# Patient Record
Sex: Female | Born: 1945 | Race: White | Hispanic: No | Marital: Married | Smoking: Former smoker
Health system: Southern US, Community
[De-identification: ages and names within clinical notes are randomized; demographics above are authoritative.]

## PROBLEM LIST (undated history)

## (undated) DIAGNOSIS — I10 Essential (primary) hypertension: Secondary | ICD-10-CM

## (undated) DIAGNOSIS — F329 Major depressive disorder, single episode, unspecified: Secondary | ICD-10-CM

## (undated) DIAGNOSIS — M1990 Unspecified osteoarthritis, unspecified site: Secondary | ICD-10-CM

## (undated) DIAGNOSIS — F32A Depression, unspecified: Secondary | ICD-10-CM

## (undated) HISTORY — PX: TUBAL LIGATION: SHX77

## (undated) HISTORY — PX: FOOT SURGERY: SHX648

## (undated) HISTORY — PX: OTHER SURGICAL HISTORY: SHX169

## (undated) HISTORY — PX: SHOULDER SURGERY: SHX246

## (undated) HISTORY — PX: REPLACEMENT TOTAL KNEE: SUR1224

## (undated) HISTORY — PX: LAMINECTOMY: SHX219

---

## 2016-12-09 ENCOUNTER — Inpatient Hospital Stay (HOSPITAL_COMMUNITY)
Admission: EM | Admit: 2016-12-09 | Discharge: 2016-12-12 | DRG: 470 | Disposition: A | Discharge status: Home / Self Care | Payer: Medicare Other | Attending: Internal Medicine | Admitting: Internal Medicine

## 2016-12-09 ENCOUNTER — Encounter (HOSPITAL_COMMUNITY): Payer: Self-pay | Admitting: Emergency Medicine

## 2016-12-09 ENCOUNTER — Emergency Department (HOSPITAL_COMMUNITY): Payer: Medicare Other

## 2016-12-09 DIAGNOSIS — W19XXXA Unspecified fall, initial encounter: Secondary | ICD-10-CM

## 2016-12-09 DIAGNOSIS — F32A Depression, unspecified: Secondary | ICD-10-CM

## 2016-12-09 DIAGNOSIS — Z79899 Other long term (current) drug therapy: Secondary | ICD-10-CM | POA: Diagnosis not present

## 2016-12-09 DIAGNOSIS — E785 Hyperlipidemia, unspecified: Secondary | ICD-10-CM | POA: Diagnosis present

## 2016-12-09 DIAGNOSIS — Y92838 Other recreation area as the place of occurrence of the external cause: Secondary | ICD-10-CM

## 2016-12-09 DIAGNOSIS — W1789XA Other fall from one level to another, initial encounter: Secondary | ICD-10-CM | POA: Diagnosis present

## 2016-12-09 DIAGNOSIS — I10 Essential (primary) hypertension: Secondary | ICD-10-CM | POA: Diagnosis present

## 2016-12-09 DIAGNOSIS — Z419 Encounter for procedure for purposes other than remedying health state, unspecified: Secondary | ICD-10-CM

## 2016-12-09 DIAGNOSIS — F329 Major depressive disorder, single episode, unspecified: Secondary | ICD-10-CM | POA: Diagnosis present

## 2016-12-09 DIAGNOSIS — Z87891 Personal history of nicotine dependence: Secondary | ICD-10-CM

## 2016-12-09 DIAGNOSIS — Z96651 Presence of right artificial knee joint: Secondary | ICD-10-CM | POA: Diagnosis present

## 2016-12-09 DIAGNOSIS — S72002A Fracture of unspecified part of neck of left femur, initial encounter for closed fracture: Secondary | ICD-10-CM | POA: Diagnosis present

## 2016-12-09 DIAGNOSIS — E039 Hypothyroidism, unspecified: Secondary | ICD-10-CM | POA: Diagnosis present

## 2016-12-09 DIAGNOSIS — M25552 Pain in left hip: Secondary | ICD-10-CM | POA: Diagnosis present

## 2016-12-09 DIAGNOSIS — S72009A Fracture of unspecified part of neck of unspecified femur, initial encounter for closed fracture: Secondary | ICD-10-CM | POA: Diagnosis present

## 2016-12-09 DIAGNOSIS — Y9311 Activity, swimming: Secondary | ICD-10-CM | POA: Diagnosis present

## 2016-12-09 DIAGNOSIS — R9439 Abnormal result of other cardiovascular function study: Secondary | ICD-10-CM | POA: Diagnosis present

## 2016-12-09 HISTORY — DX: Depression, unspecified: F32.A

## 2016-12-09 HISTORY — DX: Major depressive disorder, single episode, unspecified: F32.9

## 2016-12-09 HISTORY — DX: Unspecified osteoarthritis, unspecified site: M19.90

## 2016-12-09 HISTORY — DX: Essential (primary) hypertension: I10

## 2016-12-09 LAB — COMPREHENSIVE METABOLIC PANEL
ALBUMIN: 4.3 g/dL (ref 3.5–5.0)
ALT: 13 U/L — AB (ref 14–54)
AST: 21 U/L (ref 15–41)
Alkaline Phosphatase: 60 U/L (ref 38–126)
Anion gap: 10 (ref 5–15)
BUN: 22 mg/dL — AB (ref 6–20)
CO2: 27 mmol/L (ref 22–32)
CREATININE: 1.02 mg/dL — AB (ref 0.44–1.00)
Calcium: 9.2 mg/dL (ref 8.9–10.3)
Chloride: 103 mmol/L (ref 101–111)
GFR calc Af Amer: >60 mL/min (ref >60)
GFR calc non Af Amer: 54 mL/min — ABNORMAL LOW (ref >60)
GLUCOSE: 95 mg/dL (ref 65–99)
POTASSIUM: 3.8 mmol/L (ref 3.5–5.1)
Sodium: 140 mmol/L (ref 135–145)
Total Bilirubin: 0.7 mg/dL (ref 0.3–1.2)
Total Protein: 6.6 g/dL (ref 6.5–8.1)

## 2016-12-09 LAB — URINALYSIS, ROUTINE W REFLEX MICROSCOPIC
BILIRUBIN URINE: NEGATIVE
GLUCOSE, UA: NEGATIVE mg/dL
HGB URINE DIPSTICK: NEGATIVE
Ketones, ur: NEGATIVE mg/dL
Leukocytes, UA: NEGATIVE
Nitrite: NEGATIVE
PROTEIN: NEGATIVE mg/dL
Specific Gravity, Urine: 1.005 (ref 1.005–1.030)
pH: 6 (ref 5.0–8.0)

## 2016-12-09 LAB — CBC WITH DIFFERENTIAL/PLATELET
Basophils Absolute: 0 10*3/uL (ref 0.0–0.1)
Basophils Relative: 0 %
EOS PCT: 0 %
Eosinophils Absolute: 0 10*3/uL (ref 0.0–0.7)
HCT: 39.8 % (ref 36.0–46.0)
Hemoglobin: 13.5 g/dL (ref 12.0–15.0)
LYMPHS ABS: 1.3 10*3/uL (ref 0.7–4.0)
LYMPHS PCT: 17 %
MCH: 30.6 pg (ref 26.0–34.0)
MCHC: 33.9 g/dL (ref 30.0–36.0)
MCV: 90.2 fL (ref 78.0–100.0)
MONO ABS: 0.7 10*3/uL (ref 0.1–1.0)
MONOS PCT: 9 %
Neutro Abs: 5.8 10*3/uL (ref 1.7–7.7)
Neutrophils Relative %: 74 %
PLATELETS: 190 10*3/uL (ref 150–400)
RBC: 4.41 MIL/uL (ref 3.87–5.11)
RDW: 12.7 % (ref 11.5–15.5)
WBC: 7.8 10*3/uL (ref 4.0–10.5)

## 2016-12-09 LAB — CBG MONITORING, ED: Glucose-Capillary: 97 mg/dL (ref 65–99)

## 2016-12-09 MED ORDER — HYDROCODONE-ACETAMINOPHEN 5-325 MG PO TABS
1.0000 | ORAL_TABLET | Freq: Four times a day (QID) | ORAL | Status: DC | PRN
  Filled 2016-12-09: qty 2

## 2016-12-09 MED ORDER — FENTANYL CITRATE (PF) 100 MCG/2ML IJ SOLN
50.0000 ug | INTRAMUSCULAR | Status: DC | PRN
  Administered 2016-12-09 (×2): 50 ug via INTRAVENOUS
  Filled 2016-12-09 (×2): qty 2

## 2016-12-09 MED ORDER — HYDROMORPHONE HCL-NACL 0.5-0.9 MG/ML-% IV SOSY
0.5000 mg | PREFILLED_SYRINGE | INTRAVENOUS | Status: DC | PRN
  Administered 2016-12-09 – 2016-12-11 (×7): 0.5 mg via INTRAVENOUS
  Filled 2016-12-09 (×8): qty 1

## 2016-12-09 MED ORDER — POVIDONE-IODINE 10 % EX SWAB
2.0000 | Freq: Once | CUTANEOUS | Status: AC
Start: 2016-12-10 — End: 2016-12-10
  Administered 2016-12-10: 2 via TOPICAL

## 2016-12-09 MED ORDER — DOCUSATE SODIUM 100 MG PO CAPS
100.0000 mg | ORAL_CAPSULE | Freq: Two times a day (BID) | ORAL | Status: DC
  Administered 2016-12-09 – 2016-12-12 (×5): 100 mg via ORAL
  Filled 2016-12-09 (×5): qty 1

## 2016-12-09 MED ORDER — POLYETHYLENE GLYCOL 3350 17 G PO PACK: 17.0000 g | PACK | Freq: Every day | ORAL | Status: DC | PRN

## 2016-12-09 MED ORDER — CHLORHEXIDINE GLUCONATE 4 % EX LIQD
60.0000 mL | Freq: Once | CUTANEOUS | Status: AC
  Administered 2016-12-10: 4 via TOPICAL
  Filled 2016-12-09: qty 60

## 2016-12-09 MED ORDER — TRANEXAMIC ACID 1000 MG/10ML IV SOLN
1000.0000 mg | INTRAVENOUS | Status: AC
  Administered 2016-12-10: 1000 mg via INTRAVENOUS
  Filled 2016-12-09 (×2): qty 10

## 2016-12-09 MED ORDER — ENOXAPARIN SODIUM 30 MG/0.3ML ~~LOC~~ SOLN: 30.0000 mg | SUBCUTANEOUS | Status: DC

## 2016-12-09 MED ORDER — CEFAZOLIN SODIUM-DEXTROSE 2-4 GM/100ML-% IV SOLN
2.0000 g | INTRAVENOUS | Status: AC
  Administered 2016-12-10: 2 g via INTRAVENOUS

## 2016-12-09 MED ORDER — HYDROMORPHONE HCL 1 MG/ML IJ SOLN
1.0000 mg | INTRAMUSCULAR | Status: DC | PRN
  Administered 2016-12-09: 1 mg via INTRAVENOUS
  Filled 2016-12-09: qty 1

## 2016-12-09 MED ORDER — ENOXAPARIN SODIUM 40 MG/0.4ML ~~LOC~~ SOLN: 40.0000 mg | SUBCUTANEOUS | Status: DC

## 2016-12-09 NOTE — ED Notes (Signed)
Bed: [id number] Expected date:  Expected time:  Means of arrival:  Comments: Fall, hip pain

## 2016-12-09 NOTE — H&P (Signed)
History and Physical    Caitlin StarchSusan Hancock ZOX:[id number] DOB: [birth year] DOA: 12/09/2016  PCP: System, Pcp Not In  Patient coming from: Patient is from [place], here for vacation.   I have personally briefly reviewed patient's old medical records in [facility]  Chief Complaint: Fall, left hip pain.   HPI: Caitlin Hancock is a 71 y.o. female with medical history significant of HTN, Depression, hypothyroidism, hyperlipidemia, Abnormal stress test last year, who presents after a mechanical fall. Patient is from [place], she is here on vacation,visitingfriends. She was getting out of the pool,when she slipped, lost balance and fell on her left side. Immediatly she felt left hip pain.  She denies chest pain, dizziness, dyspnea prior to the fall.  She report SOB on exertion,after walking half mile. She had an abnormal stress test, last year. She lost 30 pounds anda " repeated stress test was better "   ED Course: Cr 1.0, normal electrolytes, Hb at 13, UA negative, chest x ray no active lung diseases, hip x ray; Acute left femoral neck fracture with proximal migration of the femoral shaft and coxa vara angulation.  Review of Systems: As per HPI otherwise 10 point review of systems negative.    Past Medical History:  Diagnosis Date  . Arthritis   . Depression   . Hypertension   Hypothyroidism Hyperlipidemia Depression HTN Degeneration of intervertebral disc    Past Surgical History:  Procedure Laterality Date  . bilateral cataract extractions    . FOOT SURGERY    . LAMINECTOMY    . left achilles tendon repair    . REPLACEMENT TOTAL KNEE    . SHOULDER SURGERY    . TUBAL LIGATION       reports that she has quit smoking. She has never used smokeless tobacco. She reports that she drinks alcohol. She reports that she does not use drugs.  No Known Allergies  Family History; Father died from complication of MVA Mother, had gastrectomy and probably died of infection, in her 5340.     Prior to Admission medications   Not on File    Physical Exam: Vitals:   12/09/16 1508 12/09/16 1530 12/09/16 1615 12/09/16 1700  BP: 138/70 136/63 (!) 148/57 111/84  Pulse: (!) 57 (!) 55 (!) 57 62  Resp: 17 13 (!) 21 17  Temp:      TempSrc:      SpO2: 96% 96% 96% 94%    Constitutional: NAD, calm, comfortable Vitals:   12/09/16 1508 12/09/16 1530 12/09/16 1615 12/09/16 1700  BP: 138/70 136/63 (!) 148/57 111/84  Pulse: (!) 57 (!) 55 (!) 57 62  Resp: 17 13 (!) 21 17  Temp:      TempSrc:      SpO2: 96% 96% 96% 94%   Eyes: PERRL, lids and conjunctivae normal ENMT: Mucous membranes are moist. Posterior pharynx clear of any exudate or lesions.Normal dentition.  Neck: normal, supple, no masses, no thyromegaly Respiratory: clear to auscultation bilaterally, no wheezing, no crackles. Normal respiratory effort. No accessory muscle use.  Cardiovascular: Regular rate and rhythm, no murmurs / rubs / gallops. No extremity edema. 2+ pedal pulses. No carotid bruits.  Abdomen: no tenderness, no masses palpated. No hepatosplenomegaly. Bowel sounds positive.  Musculoskeletal: no clubbing / cyanosis. Left LE, shorter than right, and rotated  Skin: no rashes, lesions, ulcers. No induration Neurologic: CN 2-12 grossly intact. Sensation intact, DTR normal. Strength 5/5 all except left LE not evaluated due to hip fracture.  Psychiatric: Normal judgment  and insight. Alert and oriented x 3. Normal mood.    Labs on Admission: I have personally reviewed following labs and imaging studies  CBC:  Recent Labs Lab 12/09/16 1518  WBC 7.8  NEUTROABS 5.8  HGB 13.5  HCT 39.8  MCV 90.2  PLT 190   Basic Metabolic Panel:  Recent Labs Lab 12/09/16 1518  NA 140  K 3.8  CL 103  CO2 27  GLUCOSE 95  BUN 22*  CREATININE 1.02*  CALCIUM 9.2   GFR: CrCl cannot be calculated (Unknown ideal weight.). Liver Function Tests:  Recent Labs Lab 12/09/16 1518  AST 21  ALT 13*  ALKPHOS 60   BILITOT 0.7  PROT 6.6  ALBUMIN 4.3   No results for input(s): LIPASE, AMYLASE in the last 168 hours. No results for input(s): AMMONIA in the last 168 hours. Coagulation Profile: No results for input(s): INR, PROTIME in the last 168 hours. Cardiac Enzymes: No results for input(s): CKTOTAL, CKMB, CKMBINDEX, TROPONINI in the last 168 hours. BNP (last 3 results) No results for input(s): PROBNP in the last 8760 hours. HbA1C: No results for input(s): HGBA1C in the last 72 hours. CBG:  Recent Labs Lab 12/09/16 1501  GLUCAP 97   Lipid Profile: No results for input(s): CHOL, HDL, LDLCALC, TRIG, CHOLHDL, LDLDIRECT in the last 72 hours. Thyroid Function Tests: No results for input(s): TSH, T4TOTAL, FREET4, T3FREE, THYROIDAB in the last 72 hours. Anemia Panel: No results for input(s): VITAMINB12, FOLATE, FERRITIN, TIBC, IRON, RETICCTPCT in the last 72 hours. Urine analysis:    Component Value Date/Time   COLORURINE STRAW (A) 12/09/2016 1518   APPEARANCEUR CLEAR 12/09/2016 1518   LABSPEC 1.005 12/09/2016 1518   PHURINE 6.0 12/09/2016 1518   GLUCOSEU NEGATIVE 12/09/2016 1518   HGBUR NEGATIVE 12/09/2016 1518   BILIRUBINUR NEGATIVE 12/09/2016 1518   KETONESUR NEGATIVE 12/09/2016 1518   PROTEINUR NEGATIVE 12/09/2016 1518   NITRITE NEGATIVE 12/09/2016 1518   LEUKOCYTESUR NEGATIVE 12/09/2016 1518    Radiological Exams on Admission: Dg Chest 1 View  Result Date: 12/09/2016 CLINICAL DATA:  Larey Seat today with left hip pain EXAM: CHEST 1 VIEW COMPARISON:  None. FINDINGS: No active infiltrate or effusion is seen. Mediastinal and hilar contours are unremarkable. The heart is within upper limits of normal. No acute bony abnormality is seen. There appears to be old resection of the distal left clavicle. IMPRESSION: No active lung disease.  No acute abnormality. Electronically Signed   By: Dwyane Dee M.D.   On: 12/09/2016 16:07   Dg Hip Unilat W Or Wo Pelvis 2-3 Views Left  Result Date:  12/09/2016 CLINICAL DATA:  71 y/o  F; fall with left hip pain. EXAM: DG HIP (WITH OR WITHOUT PELVIS) 2-3V LEFT COMPARISON:  None. FINDINGS: Acute left femoral neck fracture with proximal migration of the femoral shaft and coxa vara angulation. No hip dislocation. No pelvic fracture identified. IMPRESSION: Acute left femoral neck fracture with proximal migration of the femoral shaft and coxa vara angulation. Electronically Signed   By: Mitzi Hansen M.D.   On: 12/09/2016 16:10    EKG: Independently reviewed. Sinus rhythm ,bordeline T wave abnormalities.   Assessment/Plan Active Problems:   Closed left hip fracture (HCC)   Essential hypertension   Hypothyroidism   Depression  1-Left Hip Fracture;  Admit to telemetry.  Ortho consulted.  Patient has history of abnormal stress test. Will need to reviewed records prior to surgery. I have place order to request record. Will also order ECHO EKG; sinus  rythm. Will also check ECHO.  Pain management ; IV dilaudid,.  Bowel regimen ordered.  DVT prophylasix per ortho   2-Hypothyroidism. Awaiting med recto start meds.   3-HTN;  On metoprolol. Unknown dose.  4-HLD;not taking statins.   5-Others; she has Albuterol inhaler, she denies diagnosis of COPD. Does not use inhale frequently  Family will bring her meds, tonight. Chronic medications will need to be order.   DVT prophylaxis: Per ortho Code Status: Full code.  Family Communication: care discussed with patient.  Disposition Plan: to be determine.  Consults called: ortho, Dr Luiz Blare consulted by ED Admission status: inpatient.    Alba Cory MD [facility] Pager [phone]  If 7PM-7AM, please contact night-coverage [email or web address] Password [facility]  12/09/2016, 5:23 PM

## 2016-12-09 NOTE — Care Management (Signed)
ED CM reviewed CM consult. Patient is being admitted. CM will continue to follow for discharge needs. Rubie Maidrystal Gilles Trimpe RN CCM

## 2016-12-09 NOTE — ED Triage Notes (Addendum)
Per EMS, patient from home, c/o slip and fall while getting out of the pool. C/o left hip pain. Rotation noted. Denies head injury and LOC. Denies blood thinner use.   20g R AC Fentanyl with EMS  BP 148/70 HR 82 RR 18 O2 95%

## 2016-12-09 NOTE — ED Notes (Signed)
Hospitalist at bedside 

## 2016-12-09 NOTE — Consult Note (Signed)
Reason for Consult: Femoral neck fracture left side Referring Physician: Hospitalists  Caitlin Hancock is an 71 y.o. female.  HPI: The patient is a very active 71 year old female who is visiting from [place] and fell earlier today in a swimming pool. She is very active in her community at home and has had some previous hip pain. She's had a history of arthritis and has had a previous right total knee replacement. She was noted on x-ray to have a femoral neck fracture and we are consult for management.  Past Medical History:  Diagnosis Date  . Arthritis   . Depression   . Hypertension     Past Surgical History:  Procedure Laterality Date  . bilateral cataract extractions    . FOOT SURGERY    . LAMINECTOMY    . left achilles tendon repair    . REPLACEMENT TOTAL KNEE    . SHOULDER SURGERY    . TUBAL LIGATION      History reviewed. No pertinent family history.  Social History:  reports that she has quit smoking. She has never used smokeless tobacco. She reports that she drinks alcohol. She reports that she does not use drugs.  Allergies: No Known Allergies  Medications: I have reviewed the patient's current medications.  Results for orders placed or performed during the hospital encounter of 12/09/16 (from the past 48 hour(s))  CBG monitoring, ED     Status: None   Collection Time: 12/09/16  3:01 PM  Result Value Ref Range   Glucose-Capillary 97 65 - 99 mg/dL  Comprehensive metabolic panel     Status: Abnormal   Collection Time: 12/09/16  3:18 PM  Result Value Ref Range   Sodium 140 135 - 145 mmol/L   Potassium 3.8 3.5 - 5.1 mmol/L   Chloride 103 101 - 111 mmol/L   CO2 27 22 - 32 mmol/L   Glucose, Bld 95 65 - 99 mg/dL   BUN 22 (H) 6 - 20 mg/dL   Creatinine, Ser 1.02 (H) 0.44 - 1.00 mg/dL   Calcium 9.2 8.9 - 10.3 mg/dL   Total Protein 6.6 6.5 - 8.1 g/dL   Albumin 4.3 3.5 - 5.0 g/dL   AST 21 15 - 41 U/L   ALT 13 (L) 14 - 54 U/L   Alkaline Phosphatase 60 38 - 126 U/L   Total Bilirubin 0.7 0.3 - 1.2 mg/dL   GFR calc non Af Amer 54 (L) >60 mL/min   GFR calc Af Amer >60 >60 mL/min    Comment: (NOTE) The eGFR has been calculated using the CKD EPI equation. This calculation has not been validated in all clinical situations. eGFR's persistently <60 mL/min signify possible Chronic Kidney Disease.    Anion gap 10 5 - 15  CBC with Differential     Status: None   Collection Time: 12/09/16  3:18 PM  Result Value Ref Range   WBC 7.8 4.0 - 10.5 K/uL   RBC 4.41 3.87 - 5.11 MIL/uL   Hemoglobin 13.5 12.0 - 15.0 g/dL   HCT 39.8 36.0 - 46.0 %   MCV 90.2 78.0 - 100.0 fL   MCH 30.6 26.0 - 34.0 pg   MCHC 33.9 30.0 - 36.0 g/dL   RDW 12.7 11.5 - 15.5 %   Platelets 190 150 - 400 K/uL   Neutrophils Relative % 74 %   Neutro Abs 5.8 1.7 - 7.7 K/uL   Lymphocytes Relative 17 %   Lymphs Abs 1.3 0.7 - 4.0 K/uL  Monocytes Relative 9 %   Monocytes Absolute 0.7 0.1 - 1.0 K/uL   Eosinophils Relative 0 %   Eosinophils Absolute 0.0 0.0 - 0.7 K/uL   Basophils Relative 0 %   Basophils Absolute 0.0 0.0 - 0.1 K/uL  Urinalysis, Routine w reflex microscopic     Status: Abnormal   Collection Time: 12/09/16  3:18 PM  Result Value Ref Range   Color, Urine STRAW (A) YELLOW   APPearance CLEAR CLEAR   Specific Gravity, Urine 1.005 1.005 - 1.030   pH 6.0 5.0 - 8.0   Glucose, UA NEGATIVE NEGATIVE mg/dL   Hgb urine dipstick NEGATIVE NEGATIVE   Bilirubin Urine NEGATIVE NEGATIVE   Ketones, ur NEGATIVE NEGATIVE mg/dL   Protein, ur NEGATIVE NEGATIVE mg/dL   Nitrite NEGATIVE NEGATIVE   Leukocytes, UA NEGATIVE NEGATIVE    Dg Chest 1 View  Result Date: 12/09/2016 CLINICAL DATA:  [place] today with left hip pain EXAM: CHEST 1 VIEW COMPARISON:  None. FINDINGS: No active infiltrate or effusion is seen. Mediastinal and hilar contours are unremarkable. The heart is within upper limits of normal. No acute bony abnormality is seen. There appears to be old resection of the distal left clavicle.  IMPRESSION: No active lung disease.  No acute abnormality. Electronically Signed   By: Ivar Drape M.D.   On: 12/09/2016 16:07   Dg Hip Unilat W Or Wo Pelvis 2-3 Views Left  Result Date: 12/09/2016 CLINICAL DATA:  71 y/o  F; fall with left hip pain. EXAM: DG HIP (WITH OR WITHOUT PELVIS) 2-3V LEFT COMPARISON:  None. FINDINGS: Acute left femoral neck fracture with proximal migration of the femoral shaft and coxa vara angulation. No hip dislocation. No pelvic fracture identified. IMPRESSION: Acute left femoral neck fracture with proximal migration of the femoral shaft and coxa vara angulation. Electronically Signed   By: Kristine Garbe M.D.   On: 12/09/2016 16:10    ROS  ROS: I have reviewed the patient's review of systems thoroughly and there are no positive responses as relates to the HPI. Blood pressure (!) 128/49, pulse 60, temperature 98.1 F (36.7 C), temperature source Oral, resp. rate 18, height '5\' 5"'$  (1.651 m), weight 80 kg (176 lb 4.8 oz), SpO2 97 %. Physical Exam Well-developed well-nourished patient in no acute distress. Alert and oriented x3 HEENT:within normal limits Cardiac: Regular rate and rhythm Pulmonary: Lungs clear to auscultation Abdomen: Soft and nontender.  Normal active bowel sounds  Musculoskeletal: Left hip: Pain with all range of motion. Inability to flex or extend. Neurovascularly intact distally. Left groin has no significant skin issues. Assessment/Plan: 71 year old extremely active female who fell earlier today in a swimming pool after arriving from [place]. She has a femoral neck fracture with displacement.//Given her young age and extremely high activity level I think she will be better suited to total hip replacement and hemiarthroplasty. I will plan for total hip replacement tomorrow. She will be nothing by mouth after midnight tonight. I have explained the surgery to the patient as well as the risks and benefits at length. She understands the risk of  bleeding, infection, dislocation, need for further surgery, and potential although slight of death and around the time of surgery. She wishes to proceed with the surgical intervention. She will be admitted and cleared via the internal medicine service and we will plan intervention tomorrow.  Darrol Brandenburg L 12/09/2016, 6:43 PM

## 2016-12-09 NOTE — ED Provider Notes (Signed)
[facility]-EMERGENCY DEPT Provider Note   CSN: [id number] Arrival date & time: 12/09/16  1400     History   Chief Complaint Chief Complaint  Patient presents with  . Fall  . Hip Pain    HPI Lucrezia StarchSusan Galster is a 71 y.o. female.  The history is provided by the patient. No language interpreter was used.  Fall   Hip Pain    Lucrezia StarchSusan Schrom is a 71 y.o. female who presents to the Emergency Department complaining of fall. She was walking to get out of the pool and slipped after missing a step and fell, landing onto her left hip. She denies any head injury or loss of consciousness. She reports severe immediate pain to her left hip and she is unable to move her leg. She does have some numbness in her left foot. She has a history of hypertension and takes no blood thinners. Symptoms are severe and constant in nature. Past Medical History:  Diagnosis Date  . Arthritis     There are no active problems to display for this patient.   Past Surgical History:  Procedure Laterality Date  . FOOT SURGERY    . LAMINECTOMY    . REPLACEMENT TOTAL KNEE    . SHOULDER SURGERY      OB History    No data available       Home Medications    Prior to Admission medications   Not on File    Family History No family history on file.  Social History Social History  Substance Use Topics  . Smoking status: Former Games developermoker  . Smokeless tobacco: Never Used  . Alcohol use Yes     Allergies   Patient has no known allergies.   Review of Systems Review of Systems  All other systems reviewed and are negative.    Physical Exam Updated Vital Signs BP 138/70   Pulse (!) 57   Temp 98.1 F (36.7 C) (Oral)   Resp 17   SpO2 96%   Physical Exam  Constitutional: She is oriented to person, place, and time. She appears well-developed and well-nourished.  HENT:  Head: Normocephalic and atraumatic.  Cardiovascular: Normal rate and regular rhythm.   No murmur heard. Pulmonary/Chest: Effort  normal and breath sounds normal. No respiratory distress.  Abdominal: Soft. There is no tenderness. There is no rebound and no guarding.  Musculoskeletal:  Left lower extremity is externally rotated and shortened. 2+ DP pulses bilaterally.  Neurological: She is alert and oriented to person, place, and time.  Skin: Skin is warm and dry.  Psychiatric: She has a normal mood and affect. Her behavior is normal.  Nursing note and vitals reviewed.    ED Treatments / Results  Labs (all labs ordered are listed, but only abnormal results are displayed) Labs Reviewed  COMPREHENSIVE METABOLIC PANEL - Abnormal; Notable for the following:       Result Value   BUN 22 (*)    Creatinine, Ser 1.02 (*)    ALT 13 (*)    GFR calc non Af Amer 54 (*)    All other components within normal limits  CBC WITH DIFFERENTIAL/PLATELET  URINALYSIS, ROUTINE W REFLEX MICROSCOPIC  CBG MONITORING, ED    EKG  EKG Interpretation  Date/Time:  Tuesday December 09 2016 14:59:54 EDT Ventricular Rate:  59 PR Interval:    QRS Duration: 89 QT Interval:  418 QTC Calculation: 415 R Axis:   64 Text Interpretation:  Sinus rhythm Borderline T abnormalities, anterior leads  Baseline wander Confirmed by Lincoln Brigham [phone]) on 12/09/2016 3:22:31 PM Also confirmed by Lincoln Brigham [phone]), editor Madalyn Rob [phone])  on 12/09/2016 4:20:10 PM       Radiology Dg Chest 1 View  Result Date: 12/09/2016 CLINICAL DATA:  Larey Seat today with left hip pain EXAM: CHEST 1 VIEW COMPARISON:  None. FINDINGS: No active infiltrate or effusion is seen. Mediastinal and hilar contours are unremarkable. The heart is within upper limits of normal. No acute bony abnormality is seen. There appears to be old resection of the distal left clavicle. IMPRESSION: No active lung disease.  No acute abnormality. Electronically Signed   By: Dwyane Dee M.D.   On: 12/09/2016 16:07   Dg Hip Unilat W Or Wo Pelvis 2-3 Views Left  Result Date: 12/09/2016 CLINICAL DATA:   71 y/o  F; fall with left hip pain. EXAM: DG HIP (WITH OR WITHOUT PELVIS) 2-3V LEFT COMPARISON:  None. FINDINGS: Acute left femoral neck fracture with proximal migration of the femoral shaft and coxa vara angulation. No hip dislocation. No pelvic fracture identified. IMPRESSION: Acute left femoral neck fracture with proximal migration of the femoral shaft and coxa vara angulation. Electronically Signed   By: Mitzi Hansen M.D.   On: 12/09/2016 16:10    Procedures Procedures (including critical care time)  Medications Ordered in ED Medications  fentaNYL (SUBLIMAZE) injection 50 mcg (50 mcg Intravenous Given 12/09/16 1616)     Initial Impression / Assessment and Plan / ED Course  I have reviewed the triage vital signs and the nursing notes.  Pertinent labs & imaging results that were available during my care of the patient were reviewed by me and considered in my medical decision making (see chart for details).     Patient here for evaluation of injuries following a mechanical fall. She has left femoral neck fracture on imaging with good perfusion distally. Discussed with Dr. Luiz Blare with orthopedics who will see the patient in the emergency department. He will plan for surgery in the morning once cleared by medicine. Hospitalist consultation for admission. Patient updated findings of studies she is in agreement with plan for admission for further treatment.    Final Clinical Impressions(s) / ED Diagnoses   Final diagnoses:  Closed fracture of neck of left femur, initial encounter [facility])  Fall, initial encounter    New Prescriptions New Prescriptions   No medications on file     Tilden Fossa, MD 12/09/16 1625

## 2016-12-10 ENCOUNTER — Encounter (HOSPITAL_COMMUNITY): Payer: Self-pay | Admitting: Anesthesiology

## 2016-12-10 ENCOUNTER — Inpatient Hospital Stay (HOSPITAL_COMMUNITY): Payer: Medicare Other | Admitting: Anesthesiology

## 2016-12-10 ENCOUNTER — Inpatient Hospital Stay (HOSPITAL_COMMUNITY): Payer: Medicare Other

## 2016-12-10 ENCOUNTER — Encounter (HOSPITAL_COMMUNITY)
Admission: EM | Disposition: A | Discharge status: Home / Self Care | Payer: Self-pay | Source: Home / Self Care | Attending: Internal Medicine

## 2016-12-10 DIAGNOSIS — I10 Essential (primary) hypertension: Secondary | ICD-10-CM

## 2016-12-10 HISTORY — PX: TOTAL HIP ARTHROPLASTY: SHX124

## 2016-12-10 LAB — ECHOCARDIOGRAM COMPLETE
Height: 65 in
Weight: 2820.8 oz

## 2016-12-10 LAB — BASIC METABOLIC PANEL
ANION GAP: 9 (ref 5–15)
BUN: 16 mg/dL (ref 6–20)
CHLORIDE: 100 mmol/L — AB (ref 101–111)
CO2: 30 mmol/L (ref 22–32)
Calcium: 9.1 mg/dL (ref 8.9–10.3)
Creatinine, Ser: 0.88 mg/dL (ref 0.44–1.00)
GFR calc non Af Amer: >60 mL/min (ref >60)
Glucose, Bld: 115 mg/dL — ABNORMAL HIGH (ref 65–99)
POTASSIUM: 3.6 mmol/L (ref 3.5–5.1)
SODIUM: 139 mmol/L (ref 135–145)

## 2016-12-10 LAB — CBC
HCT: 38.2 % (ref 36.0–46.0)
Hemoglobin: 12.9 g/dL (ref 12.0–15.0)
MCH: 30.8 pg (ref 26.0–34.0)
MCHC: 33.8 g/dL (ref 30.0–36.0)
MCV: 91.2 fL (ref 78.0–100.0)
Platelets: 188 K/uL (ref 150–400)
RBC: 4.19 MIL/uL (ref 3.87–5.11)
RDW: 12.9 % (ref 11.5–15.5)
WBC: 8 K/uL (ref 4.0–10.5)

## 2016-12-10 LAB — SURGICAL PCR SCREEN
MRSA, PCR: NEGATIVE
STAPHYLOCOCCUS AUREUS: NEGATIVE

## 2016-12-10 SURGERY — ARTHROPLASTY, HIP, TOTAL, ANTERIOR APPROACH
Anesthesia: Spinal | Site: Hip | Laterality: Left

## 2016-12-10 SURGERY — Surgical Case
Anesthesia: *Unknown

## 2016-12-10 MED ORDER — METOPROLOL TARTRATE 25 MG PO TABS
25.0000 mg | ORAL_TABLET | Freq: Two times a day (BID) | ORAL | Status: DC
  Administered 2016-12-11 – 2016-12-12 (×3): 25 mg via ORAL
  Filled 2016-12-10 (×3): qty 1

## 2016-12-10 MED ORDER — PHENYLEPHRINE 40 MCG/ML (10ML) SYRINGE FOR IV PUSH (FOR BLOOD PRESSURE SUPPORT)
PREFILLED_SYRINGE | INTRAVENOUS | Status: DC | PRN
Start: 2016-12-10 — End: 2016-12-10
  Administered 2016-12-10 (×2): 40 ug via INTRAVENOUS

## 2016-12-10 MED ORDER — ALBUTEROL SULFATE (2.5 MG/3ML) 0.083% IN NEBU
3.0000 mL | INHALATION_SOLUTION | Freq: Four times a day (QID) | RESPIRATORY_TRACT | Status: DC
  Filled 2016-12-10: qty 3

## 2016-12-10 MED ORDER — PANTOPRAZOLE SODIUM 40 MG PO TBEC
40.0000 mg | DELAYED_RELEASE_TABLET | Freq: Every day | ORAL | Status: DC
  Administered 2016-12-11 – 2016-12-12 (×2): 40 mg via ORAL
  Filled 2016-12-10 (×2): qty 1

## 2016-12-10 MED ORDER — METHOCARBAMOL 500 MG PO TABS
500.0000 mg | ORAL_TABLET | Freq: Three times a day (TID) | ORAL | Status: DC
  Administered 2016-12-10: 500 mg via ORAL
  Filled 2016-12-10: qty 1

## 2016-12-10 MED ORDER — ALBUTEROL SULFATE (2.5 MG/3ML) 0.083% IN NEBU: 3.0000 mL | INHALATION_SOLUTION | Freq: Four times a day (QID) | RESPIRATORY_TRACT | Status: DC | PRN

## 2016-12-10 MED ORDER — LACTATED RINGERS IV SOLN
INTRAVENOUS | Status: DC
  Administered 2016-12-10: 20:00:00 via INTRAVENOUS

## 2016-12-10 MED ORDER — GABAPENTIN 100 MG PO CAPS
100.0000 mg | ORAL_CAPSULE | Freq: Every day | ORAL | Status: DC
  Administered 2016-12-11: 100 mg via ORAL
  Filled 2016-12-10: qty 1

## 2016-12-10 MED ORDER — PHENYLEPHRINE HCL 10 MG/ML IJ SOLN
INTRAMUSCULAR | Status: DC | PRN
  Administered 2016-12-10: 50 ug/min via INTRAVENOUS

## 2016-12-10 MED ORDER — FENTANYL CITRATE (PF) 100 MCG/2ML IJ SOLN
25.0000 ug | INTRAMUSCULAR | Status: DC | PRN
  Administered 2016-12-10: 25 ug via INTRAVENOUS

## 2016-12-10 MED ORDER — METHOCARBAMOL 500 MG PO TABS
500.0000 mg | ORAL_TABLET | Freq: Four times a day (QID) | ORAL | Status: DC | PRN
  Administered 2016-12-12: 500 mg via ORAL
  Filled 2016-12-10: qty 1

## 2016-12-10 MED ORDER — PROPOFOL 500 MG/50ML IV EMUL
INTRAVENOUS | Status: DC | PRN
  Administered 2016-12-10: 50 ug/kg/min via INTRAVENOUS

## 2016-12-10 MED ORDER — DEXAMETHASONE SODIUM PHOSPHATE 10 MG/ML IJ SOLN
INTRAMUSCULAR | Status: DC | PRN
  Administered 2016-12-10: 10 mg via INTRAVENOUS

## 2016-12-10 MED ORDER — ONDANSETRON HCL 4 MG/2ML IJ SOLN: 4.0000 mg | Freq: Once | INTRAMUSCULAR | Status: DC | PRN

## 2016-12-10 MED ORDER — PROPOFOL 10 MG/ML IV BOLUS
INTRAVENOUS | Status: AC
  Filled 2016-12-10: qty 20

## 2016-12-10 MED ORDER — BUPIVACAINE HCL (PF) 0.25 % IJ SOLN
INTRAMUSCULAR | Status: AC
  Filled 2016-12-10: qty 30

## 2016-12-10 MED ORDER — DOCUSATE SODIUM 100 MG PO CAPS: 100.0000 mg | ORAL_CAPSULE | Freq: Two times a day (BID) | ORAL | 0 refills | Status: DC

## 2016-12-10 MED ORDER — PROPOFOL 10 MG/ML IV BOLUS
INTRAVENOUS | Status: AC
  Filled 2016-12-10: qty 60

## 2016-12-10 MED ORDER — PROPOFOL 10 MG/ML IV BOLUS
INTRAVENOUS | Status: DC | PRN
  Administered 2016-12-10: 20 mg via INTRAVENOUS
  Administered 2016-12-10: 30 mg via INTRAVENOUS
  Administered 2016-12-10 (×2): 20 mg via INTRAVENOUS

## 2016-12-10 MED ORDER — PHENYLEPHRINE HCL 10 MG/ML IJ SOLN: 30.0000 ug/min | INTRAVENOUS | Status: DC

## 2016-12-10 MED ORDER — OXYCODONE-ACETAMINOPHEN 5-325 MG PO TABS
1.0000 | ORAL_TABLET | Freq: Four times a day (QID) | ORAL | Status: DC | PRN
  Administered 2016-12-10 (×2): 2 via ORAL
  Filled 2016-12-10 (×2): qty 2

## 2016-12-10 MED ORDER — LEVOTHYROXINE SODIUM 100 MCG PO TABS
100.0000 ug | ORAL_TABLET | Freq: Every day | ORAL | Status: DC
  Administered 2016-12-11 – 2016-12-12 (×2): 100 ug via ORAL
  Filled 2016-12-10 (×2): qty 1

## 2016-12-10 MED ORDER — METHOCARBAMOL 1000 MG/10ML IJ SOLN
500.0000 mg | Freq: Four times a day (QID) | INTRAVENOUS | Status: DC | PRN
  Administered 2016-12-10: 500 mg via INTRAVENOUS
  Filled 2016-12-10: qty 550

## 2016-12-10 MED ORDER — BUPIVACAINE HCL (PF) 0.25 % IJ SOLN
INTRAMUSCULAR | Status: DC | PRN
  Administered 2016-12-10: 30 mL

## 2016-12-10 MED ORDER — SODIUM CHLORIDE 0.9 % IR SOLN
Status: DC | PRN
  Administered 2016-12-10: 1000 mL

## 2016-12-10 MED ORDER — HYDROCODONE-ACETAMINOPHEN 5-325 MG PO TABS: 1.0000 | ORAL_TABLET | Freq: Four times a day (QID) | ORAL | 0 refills | Status: AC | PRN

## 2016-12-10 MED ORDER — BUPIVACAINE IN DEXTROSE 0.75-8.25 % IT SOLN
INTRATHECAL | Status: DC | PRN
  Administered 2016-12-10: 2 mL via INTRATHECAL

## 2016-12-10 MED ORDER — METHOCARBAMOL 500 MG PO TABS: 500.0000 mg | ORAL_TABLET | Freq: Three times a day (TID) | ORAL | Status: DC | PRN

## 2016-12-10 MED ORDER — FENTANYL CITRATE (PF) 100 MCG/2ML IJ SOLN
INTRAMUSCULAR | Status: AC
  Filled 2016-12-10: qty 2

## 2016-12-10 MED ORDER — FENTANYL CITRATE (PF) 100 MCG/2ML IJ SOLN
INTRAMUSCULAR | Status: DC | PRN
  Administered 2016-12-10: 50 ug via INTRAVENOUS

## 2016-12-10 MED ORDER — ASPIRIN EC 325 MG PO TBEC: 325.0000 mg | DELAYED_RELEASE_TABLET | Freq: Two times a day (BID) | ORAL | 0 refills | Status: AC

## 2016-12-10 MED ORDER — LACTATED RINGERS IV SOLN
INTRAVENOUS | Status: DC | PRN
  Administered 2016-12-10 (×2): via INTRAVENOUS

## 2016-12-10 MED ORDER — BUPIVACAINE LIPOSOME 1.3 % IJ SUSP
20.0000 mL | Freq: Once | INTRAMUSCULAR | Status: AC
  Administered 2016-12-10: 20 mL
  Filled 2016-12-10: qty 20

## 2016-12-10 MED ORDER — ONDANSETRON HCL 4 MG/2ML IJ SOLN
INTRAMUSCULAR | Status: DC | PRN
  Administered 2016-12-10: 4 mg via INTRAVENOUS

## 2016-12-10 MED ORDER — BUPROPION HCL ER (SR) 150 MG PO TB12
150.0000 mg | ORAL_TABLET | Freq: Every day | ORAL | Status: DC
  Administered 2016-12-11 – 2016-12-12 (×2): 150 mg via ORAL
  Filled 2016-12-10 (×2): qty 1

## 2016-12-10 MED ORDER — CEFAZOLIN SODIUM-DEXTROSE 2-4 GM/100ML-% IV SOLN
INTRAVENOUS | Status: AC
  Filled 2016-12-10: qty 100

## 2016-12-10 SURGICAL SUPPLY — 36 items
BAG ZIPLOCK 12X15 (MISCELLANEOUS) IMPLANT
BENZOIN TINCTURE PRP APPL 2/3 (GAUZE/BANDAGES/DRESSINGS) ×3 IMPLANT
BLADE SAW SGTL 18X1.27X75 (BLADE) ×2 IMPLANT
BLADE SAW SGTL 18X1.27X75MM (BLADE) ×1
CAPT HIP TOTAL 2 ×3 IMPLANT
CELLS DAT CNTRL 66122 CELL SVR (MISCELLANEOUS) ×1 IMPLANT
CLOSURE WOUND 1/2 X4 (GAUZE/BANDAGES/DRESSINGS) ×1
COVER PERINEAL POST (MISCELLANEOUS) ×3 IMPLANT
COVER SURGICAL LIGHT HANDLE (MISCELLANEOUS) ×3 IMPLANT
DRAPE STERI IOBAN 125X83 (DRAPES) ×3 IMPLANT
DRAPE U-SHAPE 47X51 STRL (DRAPES) ×6 IMPLANT
DRSG AQUACEL AG ADV 3.5X10 (GAUZE/BANDAGES/DRESSINGS) ×3 IMPLANT
DURAPREP 26ML APPLICATOR (WOUND CARE) ×3 IMPLANT
ELECT REM PT RETURN 15FT ADLT (MISCELLANEOUS) ×3 IMPLANT
GAUZE XEROFORM 1X8 LF (GAUZE/BANDAGES/DRESSINGS) IMPLANT
GLOVE BIO SURGEON STRL SZ7.5 (GLOVE) ×3 IMPLANT
GLOVE BIOGEL PI IND STRL 8 (GLOVE) ×2 IMPLANT
GLOVE BIOGEL PI INDICATOR 8 (GLOVE) ×4
GLOVE ECLIPSE 8.0 STRL XLNG CF (GLOVE) ×3 IMPLANT
GOWN STRL REUS W/TWL XL LVL3 (GOWN DISPOSABLE) ×6 IMPLANT
HANDPIECE INTERPULSE COAX TIP (DISPOSABLE) ×2
HOLDER FOLEY CATH W/STRAP (MISCELLANEOUS) ×3 IMPLANT
PACK ANTERIOR HIP CUSTOM (KITS) ×3 IMPLANT
RTRCTR WOUND ALEXIS 18CM MED (MISCELLANEOUS) ×3
SET HNDPC FAN SPRY TIP SCT (DISPOSABLE) ×1 IMPLANT
STAPLER VISISTAT 35W (STAPLE) IMPLANT
STRIP CLOSURE SKIN 1/2X4 (GAUZE/BANDAGES/DRESSINGS) ×2 IMPLANT
SUT ETHIBOND NAB CT1 #1 30IN (SUTURE) ×3 IMPLANT
SUT MNCRL AB 4-0 PS2 18 (SUTURE) IMPLANT
SUT VIC AB 0 CT1 36 (SUTURE) ×6 IMPLANT
SUT VIC AB 1 CT1 36 (SUTURE) ×6 IMPLANT
SUT VIC AB 2-0 CT1 27 (SUTURE) ×4
SUT VIC AB 2-0 CT1 TAPERPNT 27 (SUTURE) ×2 IMPLANT
TRAY FOLEY W/METER SILVER 16FR (SET/KITS/TRAYS/PACK) ×3 IMPLANT
YANKAUER SUCT BULB TIP 10FT TU (MISCELLANEOUS) ×3 IMPLANT
YANKAUER SUCT BULB TIP NO VENT (SUCTIONS) ×3 IMPLANT

## 2016-12-10 NOTE — Anesthesia Preprocedure Evaluation (Addendum)
Anesthesia Evaluation  Patient identified by MRN, date of birth, ID band Patient awake    Reviewed: Allergy & Precautions, NPO status , Patient's Chart, lab work & pertinent test results  Airway Mallampati: II  TM Distance: >3 FB Neck ROM: Full    Dental no notable dental hx.    Pulmonary former smoker,    Pulmonary exam normal breath sounds clear to auscultation       Cardiovascular hypertension, Pt. on home beta blockers and Pt. on medications Normal cardiovascular exam Rhythm:Regular Rate:Normal  ECG: SR, rate 59  ECHO:  Left ventricle: The cavity size was normal. Systolic function was normal. The estimated ejection fraction was in the range of 60% to 65%. Wall motion was normal; there were no regional wall motion abnormalities. Left ventricular diastolic function parameters were normal. Atrial septum: There was increased thickness of the septum, consistent with lipomatous hypertrophy. No defect or patent foramen ovale was identified. Pulmonary arteries: PA peak pressure: 38 mm Hg (S).  Sees cardiologist   Neuro/Psych Depression negative neurological ROS     GI/Hepatic negative GI ROS, Neg liver ROS,   Endo/Other  Hypothyroidism   Renal/GU negative Renal ROS  negative genitourinary   Musculoskeletal negative musculoskeletal ROS (+)   Abdominal   Peds negative pediatric ROS (+)  Hematology negative hematology ROS (+)   Anesthesia Other Findings Hyperlipidemia  Reproductive/Obstetrics negative OB ROS                            Anesthesia Physical Anesthesia Plan  ASA: II  Anesthesia Plan: Spinal   Post-op Pain Management:    Induction:   PONV Risk Score and Plan: 2 and Ondansetron, Dexamethasone and Propofol  Airway Management Planned:   Additional Equipment:   Intra-op Plan:   Post-operative Plan:   Informed Consent: I have reviewed the patients History and Physical,  chart, labs and discussed the procedure including the risks, benefits and alternatives for the proposed anesthesia with the patient or authorized representative who has indicated his/her understanding and acceptance.   Dental advisory given  Plan Discussed with: CRNA  Anesthesia Plan Comments:         Anesthesia Quick Evaluation

## 2016-12-10 NOTE — Brief Op Note (Signed)
12/09/2016 - 12/10/2016  8:04 PM  PATIENT:  Caitlin Hancock  71 y.o. female  PRE-OPERATIVE DIAGNOSIS:  left femoral neck fracture  POST-OPERATIVE DIAGNOSIS:  left femoral neck fracture  PROCEDURE:  Procedure(s): TOTAL HIP ARTHROPLASTY ANTERIOR APPROACH (Left)  SURGEON:  Surgeon(s) and Role:    Jodi Geralds* Sokhna Christoph, MD - Primary  PHYSICIAN ASSISTANT:   ASSISTANTS: bethune   ANESTHESIA:   spinal  EBL:  Total I/O In: 1910 [I.V.:1800; IV Piggyback:110] Out: 400 [Urine:200; Blood:200]  BLOOD ADMINISTERED:none  DRAINS: none   LOCAL MEDICATIONS USED:  MARCAINE    and OTHER experel  SPECIMEN:  No Specimen  DISPOSITION OF SPECIMEN:  N/A  COUNTS:  YES  TOURNIQUET:  * No tourniquets in log *  DICTATION: .Other Dictation: Dictation Number [id number]  PLAN OF CARE: Admit to inpatient   PATIENT DISPOSITION:  PACU - hemodynamically stable.   Delay start of Pharmacological VTE agent (>24hrs) due to surgical blood loss or risk of bleeding: no

## 2016-12-10 NOTE — Transfer of Care (Signed)
Immediate Anesthesia Transfer of Care Note  Patient: Caitlin Hancock  Procedure(s) Performed: Procedure(s): TOTAL HIP ARTHROPLASTY ANTERIOR APPROACH (Left)  Patient Location: PACU  Anesthesia Type:Spinal  Level of Consciousness:  sedated, patient cooperative and responds to stimulation  Airway & Oxygen Therapy:Patient Spontanous Breathing and Patient connected to face mask oxgen  Post-op Assessment:  Report given to PACU RN and Post -op Vital signs reviewed and stable  Post vital signs:  Reviewed and stable  Last Vitals:  Vitals:   12/10/16 1648 12/10/16 2000  BP:  103/77  Pulse:  64  Resp: 18   Temp:      Complications: No apparent anesthesia complications

## 2016-12-10 NOTE — Discharge Instructions (Signed)

## 2016-12-10 NOTE — Progress Notes (Signed)
  Echocardiogram 2D Echocardiogram has been performed.  Leta JunglingCooper, Yaasir Menken M 12/10/2016, 8:32 AM

## 2016-12-10 NOTE — Progress Notes (Addendum)
Nutrition Brief Note  RD consulted via Hip fracture protocol.  Patient reports losing 30 lb by following [employer]. Pt awaiting surgery today. Encouraged adequate protein consumption to aid in healing from surgery and to maintain muscle mass.  Wt Readings from Last 15 Encounters:  12/09/16 176 lb 4.8 oz (80 kg)    Body mass index is 29.34 kg/m. Patient meets criteria for overweight based on current BMI.   Current diet order is NPO for pending surgery. Labs and medications reviewed.   No nutrition interventions warranted at this time. If nutrition issues arise, please consult RD.   Tilda FrancoLindsey Khali Albanese, MS, RD, LDN Pager: [phone] After Hours Pager: [phone]

## 2016-12-10 NOTE — Progress Notes (Signed)
[facility] Progress Note  Patient: Caitlin Hancock ZOX:[id number]   PCP: System, Pcp Not In DOB: 1946-05-07   DOA: 12/09/2016   DOS: 12/10/2016   Date of Service: the patient was seen and examined on 12/10/2016  Subjective: Does not have any complaints of chest pain or shortness of breath. SHE does not have any chest pain or shortness of breath at rest or on exertion even prior to admission. No nausea no vomiting. No fever no chills.  Brief hospital course: Pt. with PMH of hypertension, hypothyroidism, dyslipidemia; admitted on 12/09/2016, presented with complaint of fall, was found to have left femoral fracture. Currently further plan is operative correction today..  Assessment and Plan: 1. Acute left femoral neck fracture with proximal migration of the femoral shaft and coxa vara angulation Orthopedic surgery consulted.  Continue when necessary Dilaudid for pain control. Adding Percocet, patient mentions Norco is not controlling her pain. Also scheduled Robaxin.  2.Preoperative medical evaluation Does not have Coronary revascularization/CVA within 5 years. Had a negative Recent stress test in 02/2016.(Report available in patient's physical chart.)Personally called patient's cardiologist office in [place]. Can Climb flight of stair, participates in recreational activity,does household chores. No Prior adverse event with anesthesia. No Alcohol use, drug use.  A) Cardiac risk: Based on RCRI With this the patient is a low risk for adverse Cardiac outcome from surgery. Recommend further no work up. Echocardiogram monitor shows preserved EF without any wall motion abnormalities.  3. Essential hypertension. Continue metoprolol.  4. Hypothyroidism. Continue Synthroid.  5. Morbid disorder. Continuing Wellbutrin.  Diet: NPO DVT Prophylaxis: subcutaneous Heparin  Advance goals of care discussion: full code  Family Communication: family was present at bedside, at the time of  interview. The pt provided permission to discuss medical plan with the family. Opportunity was given to ask question and all questions were answered satisfactorily.   Disposition:  Discharge to home.  Consultants: orthopedics Procedures: Echocardiogram  Antibiotics: Anti-infectives    Start     Dose/Rate Route Frequency Ordered Stop   12/10/16 1230  ceFAZolin (ANCEF) IVPB 2g/100 mL premix     2 g 200 mL/hr over 30 Minutes Intravenous On call to O.R. 12/09/16 1947 12/11/16 0559       Objective: Physical Exam: Vitals:   12/10/16 0331 12/10/16 0629 12/10/16 0928 12/10/16 1322  BP: (!) 107/58 (!) 121/54 (!) 121/59 (!) 144/75  Pulse: 67 64 66 74  Resp: 18 18 18 18   Temp: 98.4 F (36.9 C) 98.2 F (36.8 C) 98.1 F (36.7 C) 98.4 F (36.9 C)  TempSrc: Oral Oral Oral Oral  SpO2: 93% 94% 94% 93%  Weight:      Height:        Intake/Output Summary (Last 24 hours) at 12/10/16 1538 Last data filed at 12/10/16 1300  Gross per 24 hour  Intake                0 ml  Output             1800 ml  Net            -1800 ml   Filed Weights   12/09/16 1726  Weight: 80 kg (176 lb 4.8 oz)   General: Alert, Awake and Oriented to Time, Place and Person. Appear in mild distress, affect appropriate Eyes: PERRL, Conjunctiva normal ENT: Oral Mucosa clear moist. Neck: no JVD, no Abnormal Mass Or lumps Cardiovascular: S1 and S2 Present, no Murmur, Peripheral Pulses Present Respiratory: normal respiratory effort, Bilateral Air  entry equal and Decreased, no use of accessory muscle, Clear to Auscultation, no Crackles, no wheezes Abdomen: Bowel Sound present, Soft and no tenderness, no hernia Skin: no redness, no Rash, no induration Extremities: no Pedal edema, no calf tenderness Neurologic: Grossly no focal neuro deficit. Bilaterally Equal motor strength  Data Reviewed: CBC:  Recent Labs Lab 12/09/16 1518 12/10/16 0452  WBC 7.8 8.0  NEUTROABS 5.8  --   HGB 13.5 12.9  HCT 39.8 38.2  MCV  90.2 91.2  PLT 190 188   Basic Metabolic Panel:  Recent Labs Lab 12/09/16 1518 12/10/16 0452  NA 140 139  K 3.8 3.6  CL 103 100*  CO2 27 30  GLUCOSE 95 115*  BUN 22* 16  CREATININE 1.02* 0.88  CALCIUM 9.2 9.1    Liver Function Tests:  Recent Labs Lab 12/09/16 1518  AST 21  ALT 13*  ALKPHOS 60  BILITOT 0.7  PROT 6.6  ALBUMIN 4.3   No results for input(s): LIPASE, AMYLASE in the last 168 hours. No results for input(s): AMMONIA in the last 168 hours. Coagulation Profile: No results for input(s): INR, PROTIME in the last 168 hours. Cardiac Enzymes: No results for input(s): CKTOTAL, CKMB, CKMBINDEX, TROPONINI in the last 168 hours. BNP (last 3 results) No results for input(s): PROBNP in the last 8760 hours. CBG:  Recent Labs Lab 12/09/16 1501  GLUCAP 97   Studies: Dg Chest 1 View  Result Date: 12/09/2016 CLINICAL DATA:  Larey SeatFell today with left hip pain EXAM: CHEST 1 VIEW COMPARISON:  None. FINDINGS: No active infiltrate or effusion is seen. Mediastinal and hilar contours are unremarkable. The heart is within upper limits of normal. No acute bony abnormality is seen. There appears to be old resection of the distal left clavicle. IMPRESSION: No active lung disease.  No acute abnormality. Electronically Signed   By: Dwyane DeePaul  Barry M.D.   On: 12/09/2016 16:07   Dg Hip Unilat W Or Wo Pelvis 2-3 Views Left  Result Date: 12/09/2016 CLINICAL DATA:  71 y/o  F; fall with left hip pain. EXAM: DG HIP (WITH OR WITHOUT PELVIS) 2-3V LEFT COMPARISON:  None. FINDINGS: Acute left femoral neck fracture with proximal migration of the femoral shaft and coxa vara angulation. No hip dislocation. No pelvic fracture identified. IMPRESSION: Acute left femoral neck fracture with proximal migration of the femoral shaft and coxa vara angulation. Electronically Signed   By: Mitzi HansenLance  Furusawa-Stratton M.D.   On: 12/09/2016 16:10    Scheduled Meds: . docusate sodium  100 mg Oral BID  . methocarbamol  500  mg Oral TID   Continuous Infusions: .  ceFAZolin (ANCEF) IV    . tranexamic acid     PRN Meds: HYDROmorphone (DILAUDID) injection, oxyCODONE-acetaminophen, polyethylene glycol  Time spent: 35 minutes  Author: Lynden OxfordPranav Mikela Senn, MD [facility] Pager: [phone] 12/10/2016 3:38 PM  If 7PM-7AM, please contact night-coverage at [email or web address], password [email or web address]

## 2016-12-10 NOTE — Anesthesia Procedure Notes (Signed)
Spinal  Patient location during procedure: OR Start time: 12/10/2016 6:05 PM End time: 12/10/2016 6:15 PM Staffing Anesthesiologist: Karna ChristmasELLENDER, RYAN P Performed: anesthesiologist  Preanesthetic Checklist Completed: patient identified, surgical consent, pre-op evaluation, timeout performed, IV checked, risks and benefits discussed and monitors and equipment checked Spinal Block Patient position: left lateral decubitus Prep: DuraPrep Patient monitoring: cardiac monitor, continuous pulse ox and blood pressure Approach: left paramedian Location: L3-4 Injection technique: single-shot Needle Needle type: Pencan  Needle gauge: 24 G Needle length: 9 cm Assessment Sensory level: T10 Additional Notes Functioning IV was confirmed and monitors were applied. Sterile prep and drape, including hand hygiene and sterile gloves were used. The patient was positioned and the spine was prepped. The skin was anesthetized with lidocaine.  Free flow of clear CSF was obtained prior to injecting local anesthetic into the CSF.  The spinal needle aspirated freely following injection.  The needle was carefully withdrawn.  The patient tolerated the procedure well.

## 2016-12-11 ENCOUNTER — Encounter (HOSPITAL_COMMUNITY): Payer: Self-pay | Admitting: Orthopedic Surgery

## 2016-12-11 DIAGNOSIS — I10 Essential (primary) hypertension: Secondary | ICD-10-CM

## 2016-12-11 DIAGNOSIS — S72002A Fracture of unspecified part of neck of left femur, initial encounter for closed fracture: Principal | ICD-10-CM

## 2016-12-11 LAB — BASIC METABOLIC PANEL
ANION GAP: 9 (ref 5–15)
BUN: 14 mg/dL (ref 6–20)
CO2: 26 mmol/L (ref 22–32)
Calcium: 8.4 mg/dL — ABNORMAL LOW (ref 8.9–10.3)
Chloride: 101 mmol/L (ref 101–111)
Creatinine, Ser: 0.83 mg/dL (ref 0.44–1.00)
Glucose, Bld: 271 mg/dL — ABNORMAL HIGH (ref 65–99)
POTASSIUM: 3.7 mmol/L (ref 3.5–5.1)
SODIUM: 136 mmol/L (ref 135–145)

## 2016-12-11 LAB — CBC
HCT: 33.8 % — ABNORMAL LOW (ref 36.0–46.0)
Hemoglobin: 11.4 g/dL — ABNORMAL LOW (ref 12.0–15.0)
MCH: 30.6 pg (ref 26.0–34.0)
MCHC: 33.7 g/dL (ref 30.0–36.0)
MCV: 90.9 fL (ref 78.0–100.0)
PLATELETS: 158 10*3/uL (ref 150–400)
RBC: 3.72 MIL/uL — ABNORMAL LOW (ref 3.87–5.11)
RDW: 12.9 % (ref 11.5–15.5)
WBC: 8.2 10*3/uL (ref 4.0–10.5)

## 2016-12-11 LAB — MAGNESIUM: MAGNESIUM: 1.8 mg/dL (ref 1.7–2.4)

## 2016-12-11 MED ORDER — SENNOSIDES-DOCUSATE SODIUM 8.6-50 MG PO TABS
1.0000 | ORAL_TABLET | Freq: Two times a day (BID) | ORAL | Status: DC
  Administered 2016-12-11 – 2016-12-12 (×3): 1 via ORAL
  Filled 2016-12-11 (×3): qty 1

## 2016-12-11 MED ORDER — TRANEXAMIC ACID 1000 MG/10ML IV SOLN
1000.0000 mg | Freq: Once | INTRAVENOUS | Status: DC
  Filled 2016-12-11: qty 10

## 2016-12-11 MED ORDER — ALUM & MAG HYDROXIDE-SIMETH 200-200-20 MG/5ML PO SUSP: 30.0000 mL | ORAL | Status: DC | PRN

## 2016-12-11 MED ORDER — LOSARTAN POTASSIUM 50 MG PO TABS
50.0000 mg | ORAL_TABLET | Freq: Every day | ORAL | Status: DC
  Administered 2016-12-11 – 2016-12-12 (×2): 50 mg via ORAL
  Filled 2016-12-11 (×2): qty 1

## 2016-12-11 MED ORDER — ACETAMINOPHEN 325 MG PO TABS: 650.0000 mg | ORAL_TABLET | Freq: Four times a day (QID) | ORAL | Status: DC | PRN

## 2016-12-11 MED ORDER — ACETAMINOPHEN 650 MG RE SUPP: 650.0000 mg | Freq: Four times a day (QID) | RECTAL | Status: DC | PRN

## 2016-12-11 MED ORDER — BISACODYL 5 MG PO TBEC: 5.0000 mg | DELAYED_RELEASE_TABLET | Freq: Every day | ORAL | Status: DC | PRN

## 2016-12-11 MED ORDER — MAGNESIUM CITRATE PO SOLN: 1.0000 | Freq: Once | ORAL | Status: DC | PRN

## 2016-12-11 MED ORDER — HYDROCHLOROTHIAZIDE 25 MG PO TABS
25.0000 mg | ORAL_TABLET | ORAL | Status: DC
  Administered 2016-12-12: 25 mg via ORAL
  Filled 2016-12-11: qty 1

## 2016-12-11 MED ORDER — HYDROCODONE-ACETAMINOPHEN 5-325 MG PO TABS
1.0000 | ORAL_TABLET | Freq: Four times a day (QID) | ORAL | Status: DC | PRN
  Administered 2016-12-11: 1 via ORAL
  Administered 2016-12-11 (×2): 2 via ORAL
  Administered 2016-12-12 (×2): 1 via ORAL
  Filled 2016-12-11: qty 2
  Filled 2016-12-11 (×3): qty 1
  Filled 2016-12-11: qty 2

## 2016-12-11 MED ORDER — ONDANSETRON HCL 4 MG/2ML IJ SOLN: 4.0000 mg | Freq: Four times a day (QID) | INTRAMUSCULAR | Status: DC | PRN

## 2016-12-11 MED ORDER — CEFAZOLIN SODIUM-DEXTROSE 2-4 GM/100ML-% IV SOLN
2.0000 g | Freq: Four times a day (QID) | INTRAVENOUS | Status: AC
  Administered 2016-12-11 (×2): 2 g via INTRAVENOUS
  Filled 2016-12-11 (×2): qty 100

## 2016-12-11 MED ORDER — ASPIRIN EC 325 MG PO TBEC
325.0000 mg | DELAYED_RELEASE_TABLET | Freq: Two times a day (BID) | ORAL | Status: DC
  Administered 2016-12-11 – 2016-12-12 (×3): 325 mg via ORAL
  Filled 2016-12-11 (×3): qty 1

## 2016-12-11 MED ORDER — DEXTROSE-NACL 5-0.45 % IV SOLN
INTRAVENOUS | Status: DC
  Administered 2016-12-11: 02:00:00 via INTRAVENOUS

## 2016-12-11 MED ORDER — ONDANSETRON HCL 4 MG PO TABS: 4.0000 mg | ORAL_TABLET | Freq: Four times a day (QID) | ORAL | Status: DC | PRN

## 2016-12-11 MED ORDER — ZOLPIDEM TARTRATE 5 MG PO TABS
5.0000 mg | ORAL_TABLET | Freq: Every evening | ORAL | Status: DC | PRN
  Administered 2016-12-11: 5 mg via ORAL
  Filled 2016-12-11: qty 1

## 2016-12-11 NOTE — Anesthesia Postprocedure Evaluation (Signed)
Anesthesia Post Note  Patient: Caitlin StarchSusan Routh  Procedure(s) Performed: Procedure(s) (LRB): TOTAL HIP ARTHROPLASTY ANTERIOR APPROACH (Left)     Patient location during evaluation: PACU Anesthesia Type: Spinal Level of consciousness: oriented and awake and alert Pain management: pain level controlled Vital Signs Assessment: post-procedure vital signs reviewed and stable Respiratory status: spontaneous breathing, respiratory function stable and patient connected to nasal cannula oxygen Cardiovascular status: blood pressure returned to baseline and stable Postop Assessment: no headache and no backache Anesthetic complications: no    Last Vitals:  Vitals:   12/11/16 0622 12/11/16 1424  BP: 116/72 (!) 129/59  Pulse: 67 76  Resp: 16 18  Temp: 36.6 C 36.6 C    Last Pain:  Vitals:   12/11/16 1424  TempSrc: Oral  PainSc:                  [name]

## 2016-12-11 NOTE — Evaluation (Signed)
Occupational Therapy Evaluation and Discharge Patient Details Name: Caitlin Hancock MRN: [id number] DOB: [birth year] Today's Date: 12/11/2016    History of Present Illness 70 yo female admitted after falling and sustaining a hip fx. S/P L THA-anterior approach.    Clinical Impression   This 71 yo female admitted and underwent above presents to acute OT with all OT education completed, we will D/C from acute OT. Of note there was an audible pop when pt crossed LLE over RLE to take sock off--no increase in pain per pt.    Follow Up Recommendations  No OT follow up;Supervision/Assistance - 24 hour    Equipment Recommendations  None recommended by OT       Precautions / Restrictions Precautions Precautions: Fall Precaution Comments: no hip precautions Restrictions Weight Bearing Restrictions: No LLE Weight Bearing: Weight bearing as tolerated      Mobility Bed Mobility Overal bed mobility: Needs Assistance Bed Mobility: Supine to Sit;Sit to Supine     Supine to sit: Supervision Sit to supine: Supervision   General bed mobility comments: HOB down, use of rails  Transfers Overall transfer level: Needs assistance Equipment used: Rolling walker (2 wheeled) Transfers: Sit to/from Stand Sit to Stand: Min guard            Balance Overall balance assessment: Needs assistance;History of Falls Sitting-balance support: No upper extremity supported;Feet supported Sitting balance-Leahy Scale: Good     Standing balance support: Bilateral upper extremity supported;During functional activity Standing balance-Leahy Scale: Poor                             ADL either performed or assessed with clinical judgement   ADL Overall ADL's : Needs assistance/impaired Eating/Feeding: Independent;Sitting   Grooming: Set up;Sitting   Upper Body Bathing: Set up;Sitting   Lower Body Bathing: Minimal assistance Lower Body Bathing Details (indicate cue type and reason): min  guard A sit<>stand Upper Body Dressing : Set up;Sitting   Lower Body Dressing: Minimal assistance Lower Body Dressing Details (indicate cue type and reason): min guard A sit<>stand; could get left sock off, but not back on by herself Toilet Transfer: Min guard;Ambulation;BSC;RW Toilet Transfer Details (indicate cue type and reason): over toilet Toileting- Clothing Manipulation and Hygiene: Min guard;Sit to/from stand               Vision Patient Visual Report: No change from baseline              Pertinent Vitals/Pain Pain Assessment: 0-10 Pain Score: 5  Pain Location: L LE Pain Descriptors / Indicators: Aching;Sore Pain Intervention(s): Monitored during session;Repositioned     Hand Dominance Right   Extremity/Trunk Assessment Upper Extremity Assessment Upper Extremity Assessment: Overall WFL for tasks assessed     Communication Communication Communication: No difficulties   Cognition Arousal/Alertness: Awake/alert Behavior During Therapy: WFL for tasks assessed/performed Overall Cognitive Status: Within Functional Limits for tasks assessed                                                Home Living Family/patient expects to be discharged to:: Private residence Living Arrangements: Non-relatives/Friends Available Help at Discharge: Friend(s) Type of Home: House Home Access: Stairs to enter [employer] of Steps: 1-front Entrance Stairs-Rails: None Home Layout: Two level;Able to live on main level with bedroom/bathroom Alternate Level  Stairs-Number of Steps: 1 flight Alternate Level Stairs-Rails: Right Bathroom Shower/Tub: Walk-in shower;Door   Bathroom Toilet: Standard     Foot LockerHome Equipment: Bedside commode   Additional Comments: will borrow RW from friend (per pt)      Prior Functioning/Environment Level of Independence: Independent                 OT Problem List: Decreased range of motion;Impaired balance  (sitting and/or standing);Pain         OT Goals(Current goals can be found in the care plan section) Acute Rehab OT Goals Patient Stated Goal: regain independence  OT Frequency:                AM-PAC PT "6 Clicks" Daily Activity     Outcome Measure Help from another person eating meals?: None Help from another person taking care of personal grooming?: None Help from another person toileting, which includes using toliet, bedpan, or urinal?: A Little Help from another person bathing (including washing, rinsing, drying)?: A Little Help from another person to put on and taking off regular upper body clothing?: None Help from another person to put on and taking off regular lower body clothing?: A Little 6 Click Score: 21   End of Session Equipment Utilized During Treatment: Rolling walker  Activity Tolerance: Patient tolerated treatment well Patient left: in bed;with call bell/phone within reach;with bed alarm set  OT Visit Diagnosis: Unsteadiness on feet (R26.81);Pain Pain - Right/Left: Left Pain - part of body: Hip                Time: [id number] OT Time Calculation (min): 21 min Charges:  OT General Charges $OT Visit: 1 Procedure OT Evaluation $OT Eval Moderate Complexity: 1 Procedure Ignacia PalmaCathy Adelfo Diebel, OTR/L [phone] 12/11/2016

## 2016-12-11 NOTE — Progress Notes (Signed)
Subjective: 1 Day Post-Op Procedure(s) (LRB): TOTAL HIP ARTHROPLASTY ANTERIOR APPROACH (Left) Patient reports pain as mild.  Foley catheter still in place. Taking by mouth okay.  Objective: Vital signs in last 24 hours: Temp:  [97.8 F (36.6 C)-98.6 F (37 C)] 97.8 F (36.6 C) (07/12 0622) Pulse Rate:  [61-74] 67 (07/12 0622) Resp:  [9-19] 16 (07/12 0622) BP: (101-144)/(55-77) 116/72 (07/12 0622) SpO2:  [93 %-100 %] 98 % (07/12 0622)  Intake/Output from previous day: 07/11 0701 - 07/12 0700 In: 3225 [P.O.:480; I.V.:2425; IV Piggyback:320] Out: 2550 [Urine:2350; Blood:200] Intake/Output this shift: Total I/O In: 240 [P.O.:240] Out: -    Recent Labs  12/09/16 1518 12/10/16 0452 12/11/16 0403  HGB 13.5 12.9 11.4*    Recent Labs  12/10/16 0452 12/11/16 0403  WBC 8.0 8.2  RBC 4.19 3.72*  HCT 38.2 33.8*  PLT 188 158    Recent Labs  12/10/16 0452 12/11/16 0403  NA 139 136  K 3.6 3.7  CL 100* 101  CO2 30 26  BUN 16 14  CREATININE 0.88 0.83  GLUCOSE 115* 271*  CALCIUM 9.1 8.4*   No results for input(s): LABPT, INR in the last 72 hours. Left hip exam: Neurologically intact Neurovascular intact Sensation intact distally Intact pulses distally Dorsiflexion/Plantar flexion intact Incision: dressing C/D/I Compartment soft  Assessment/Plan: 1 Day Post-Op Procedure(s) (LRB): TOTAL HIP ARTHROPLASTY ANTERIOR APPROACH (Left) Plan: Weight-bear as tolerated on left hip without hip precautions. Aspirin 325 mg twice daily for DVT prophylaxis 1 month postop. Along with SCDs. Up with therapy Plan for discharge tomorrow Discharge home with home health tomorrow. She [place] staying [place] She can follow-up with her orthopedist there. She should be ready for discharge tomorrow.  Ciera Beckum G 12/11/2016, 8:27 AM

## 2016-12-11 NOTE — Progress Notes (Signed)
Pt is a bundle followed by Angiulli, RN, [facility] Pt discharging home with [facility].

## 2016-12-11 NOTE — Progress Notes (Signed)
[facility] Progress Note  Patient: Caitlin StarchSusan Eskew ZOX:[id number]   PCP: System, Pcp Not In DOB: 13-Dec-1945   DOA: 12/09/2016   DOS: 12/11/2016   Date of Service: the patient was seen and examined on 12/11/2016  Subjective: Does not have any complaints of chest pain or shortness of breath. SHE does not have any chest pain or shortness of breath at rest or on exertion even prior to admission. No nausea no vomiting. No fever no chills.  Brief hospital course: Pt. with PMH of hypertension, hypothyroidism, dyslipidemia; admitted on 12/09/2016, presented with complaint of fall, was found to have left femoral fracture. Currently further plan is operative correction today..  Assessment and Plan: 1. Acute left femoral neck fracture with proximal migration of the femoral shaft and coxa vara angulation Orthopedic surgery consulted, s/p TOTAL HIP ARTHROPLASTY ANTERIOR APPROACH (Left) last evening -pain control -PT consulted -DVt proph: ASA per Ortho 325mg  BID -home tomorrow if stable  2. Essential hypertension. Continue metoprolol.  3. Hypothyroidism. Continue Synthroid.  5. Depresison. Continue Wellbutrin.  DVT Prophylaxis: subcutaneous Heparin  Advance goals of care discussion: full code Family Communication: none at bedside Disposition: Discharge to home tomorrow  Consultants: orthopedics Procedures: Echocardiogram  Antibiotics: Anti-infectives    Start     Dose/Rate Route Frequency Ordered Stop   12/11/16 0300  ceFAZolin (ANCEF) IVPB 2g/100 mL premix     2 g 200 mL/hr over 30 Minutes Intravenous Every 6 hours 12/11/16 0238 12/11/16 0908   12/10/16 1745  ceFAZolin (ANCEF) 2-4 GM/100ML-% IVPB    Comments:  Mirian Moarver, Kelley   : cabinet override      12/10/16 1745 12/10/16 1816   12/10/16 1230  ceFAZolin (ANCEF) IVPB 2g/100 mL premix     2 g 200 mL/hr over 30 Minutes Intravenous On call to O.R. 12/09/16 1947 12/10/16 1816       Objective: Physical Exam: Vitals:   12/10/16  2205 12/11/16 0200 12/11/16 0622 12/11/16 1424  BP: 118/61 (!) 107/58 116/72 (!) 129/59  Pulse: 66 68 67 76  Resp: 12 19 16 18   Temp: 98 F (36.7 C) 98.6 F (37 C) 97.8 F (36.6 C) 97.9 F (36.6 C)  TempSrc: Oral Oral Oral Oral  SpO2: 95% 95% 98% 97%  Weight:      Height:        Intake/Output Summary (Last 24 hours) at 12/11/16 1603 Last data filed at 12/11/16 1300  Gross per 24 hour  Intake          4223.75 ml  Output             2950 ml  Net          1273.75 ml   Filed Weights   12/09/16 1726  Weight: 80 kg (176 lb 4.8 oz)   Gen: Awake, Alert, Oriented X 3,  HEENT: PERRLA, Neck supple, no JVD Lungs: Good air movement bilaterally, CTAB CVS: RRR,No Gallops,Rubs or new Murmurs Abd: soft, Non tender, non distended, BS present Extremities: No Cyanosis, Clubbing or edema Skin: no new rashes, L hip, incision c/d/i   Data Reviewed: CBC:  Recent Labs Lab 12/09/16 1518 12/10/16 0452 12/11/16 0403  WBC 7.8 8.0 8.2  NEUTROABS 5.8  --   --   HGB 13.5 12.9 11.4*  HCT 39.8 38.2 33.8*  MCV 90.2 91.2 90.9  PLT 190 188 158   Basic Metabolic Panel:  Recent Labs Lab 12/09/16 1518 12/10/16 0452 12/11/16 0352 12/11/16 0403  NA 140 139  --  136  K 3.8 3.6  --  3.7  CL 103 100*  --  101  CO2 27 30  --  26  GLUCOSE 95 115*  --  271*  BUN 22* 16  --  14  CREATININE 1.02* 0.88  --  0.83  CALCIUM 9.2 9.1  --  8.4*  MG  --   --  1.8  --     Liver Function Tests:  Recent Labs Lab 12/09/16 1518  AST 21  ALT 13*  ALKPHOS 60  BILITOT 0.7  PROT 6.6  ALBUMIN 4.3   No results for input(s): LIPASE, AMYLASE in the last 168 hours. No results for input(s): AMMONIA in the last 168 hours. Coagulation Profile: No results for input(s): INR, PROTIME in the last 168 hours. Cardiac Enzymes: No results for input(s): CKTOTAL, CKMB, CKMBINDEX, TROPONINI in the last 168 hours. BNP (last 3 results) No results for input(s): PROBNP in the last 8760 hours. CBG:  Recent Labs Lab  12/09/16 1501  GLUCAP 97   Studies: Dg C-arm 1-60 Min  Result Date: 12/10/2016 CLINICAL DATA:  Left hip arthroplasty EXAM: OPERATIVE LEFT HIP (WITH PELVIS IF PERFORMED) 4 VIEWS TECHNIQUE: Fluoroscopic spot image(s) were submitted for interpretation post-operatively. COMPARISON:  None. FLUOROSCOPY TIME:  16 seconds FINDINGS: Intraoperative fluoroscopic images during left hip arthroplasty. Left hip arthroplasty in satisfactory position. No fracture or dislocation is seen. IMPRESSION: Intraoperative fluoroscopic images during left hip arthroplasty. Electronically Signed   By: Charline Bills M.D.   On: 12/10/2016 19:43   Dg Hip Operative Unilat With Pelvis Left  Result Date: 12/10/2016 CLINICAL DATA:  Left hip arthroplasty EXAM: OPERATIVE LEFT HIP (WITH PELVIS IF PERFORMED) 4 VIEWS TECHNIQUE: Fluoroscopic spot image(s) were submitted for interpretation post-operatively. COMPARISON:  None. FLUOROSCOPY TIME:  16 seconds FINDINGS: Intraoperative fluoroscopic images during left hip arthroplasty. Left hip arthroplasty in satisfactory position. No fracture or dislocation is seen. IMPRESSION: Intraoperative fluoroscopic images during left hip arthroplasty. Electronically Signed   By: Charline Bills M.D.   On: 12/10/2016 19:43    Scheduled Meds: . aspirin EC  325 mg Oral BID PC  . buPROPion  150 mg Oral Daily  . docusate sodium  100 mg Oral BID  . gabapentin  100 mg Oral QHS  . [START ON 12/12/2016] hydrochlorothiazide  25 mg Oral Once per day on Mon Wed Fri  . levothyroxine  100 mcg Oral QAC breakfast  . losartan  50 mg Oral Daily  . metoprolol tartrate  25 mg Oral BID  . pantoprazole  40 mg Oral Daily  . senna-docusate  1 tablet Oral BID   Continuous Infusions: . methocarbamol (ROBAXIN)  IV 500 mg (12/10/16 2126)   PRN Meds: acetaminophen **OR** acetaminophen, albuterol, alum & mag hydroxide-simeth, bisacodyl, HYDROcodone-acetaminophen, HYDROmorphone (DILAUDID) injection, magnesium citrate,  methocarbamol **OR** methocarbamol (ROBAXIN)  IV, ondansetron **OR** ondansetron (ZOFRAN) IV, polyethylene glycol, zolpidem  Time spent: 35 minutes  Author: Zannie Cove, MD [facility] Pager: [phone] 12/11/2016 4:03 PM  If 7PM-7AM, please contact night-coverage at [email or web address], password [facility]

## 2016-12-11 NOTE — Evaluation (Signed)
Physical Therapy Evaluation Patient Details Name: Caitlin StarchSusan Kimbley MRN: [id number] DOB: 06/16/1945 Today's Date: 12/11/2016   History of Present Illness  71 yo female admitted after falling and sustaining a hip fx. S/P L THA-anterior approach.   Clinical Impression  On eval, pt required Min assist for mobility. She walked ~100 feet with a RW. Pain rated 6/10 with activity. Anticipate pt will progress well. She plans to d/c [place] a [place] Recommend HH[place] f/u. Will progress activity as tolerated. Plan is for d/c home tomorrow.     Follow Up Recommendations Home health PT    Equipment Recommendations  None recommended by PT (pt stated she will borrow walker)    Recommendations for Other Services OT consult     Precautions / Restrictions Precautions Precautions: Fall Precaution Comments: no hip precautions Restrictions Weight Bearing Restrictions: No LLE Weight Bearing: Weight bearing as tolerated      Mobility  Bed Mobility Overal bed mobility: Needs Assistance Bed Mobility: Supine to Sit     Supine to sit: HOB elevated     General bed mobility comments: close guard for safety. VCs technique.   Transfers Overall transfer level: Needs assistance Equipment used: Rolling walker (2 wheeled) Transfers: Sit to/from Stand Sit to Stand: Min guard         General transfer comment: close guard for safety. VCs safety, hand/LE placement  Ambulation/Gait Ambulation/Gait assistance: Min assist Ambulation Distance (Feet): 100 Feet Assistive device: Rolling walker (2 wheeled) Gait Pattern/deviations: Step-to pattern;Step-through pattern;Decreased stride length     General Gait Details: Intermittent assist to stabilize and maneuver with RW. VCs safety, sequence, distance from walker.   Stairs            Wheelchair Mobility    Modified Rankin (Stroke Patients Only)       Balance Overall balance assessment: Needs assistance;History of  Falls         Standing balance support: Bilateral upper extremity supported Standing balance-Leahy Scale: Poor                               Pertinent Vitals/Pain Pain Assessment: 0-10 Pain Score: 6  Pain Location: L LE Pain Descriptors / Indicators: Sore Pain Intervention(s): Monitored during session;Repositioned    Home Living Family/patient expects to be discharged to:: Private residence Living Arrangements: Non-relatives/Friends Available Help at Discharge: Friend(s) Type of Home: House Home Access: Stairs to enter Entrance Stairs-Rails: None Entrance Stairs-Number of Steps: 1-front Home Layout: Two level;Able to live on main level with bedroom/bathroom   Additional Comments: will borrow RW from friend (per pt)    Prior Function Level of Independence: Independent               Hand Dominance        Extremity/Trunk Assessment   Upper Extremity Assessment Upper Extremity Assessment: Defer to OT evaluation    Lower Extremity Assessment Lower Extremity Assessment: Generalized weakness (s/p L THA)    Cervical / Trunk Assessment Cervical / Trunk Assessment: Normal  Communication   Communication: No difficulties  Cognition Arousal/Alertness: Awake/alert Behavior During Therapy: WFL for tasks assessed/performed Overall Cognitive Status: Within Functional Limits for tasks assessed                                        General Comments  Exercises     Assessment/Plan    PT Assessment Patient needs continued PT services  PT Problem List Decreased strength;Decreased mobility;Decreased activity tolerance;Decreased balance;Pain;Decreased knowledge of use of DME       PT Treatment Interventions DME instruction;Therapeutic activities;Gait training;Therapeutic exercise;Patient/family education;Functional mobility training;Balance training    PT Goals (Current goals can be found in the Care Plan section)  Acute Rehab PT  Goals Patient Stated Goal: regain independence PT Goal Formulation: With patient Time For Goal Achievement: 12/25/16 Potential to Achieve Goals: Good    Frequency Min 5X/week   Barriers to discharge        Co-evaluation               AM-PAC PT "6 Clicks" Daily Activity  Outcome Measure Difficulty turning over in bed (including adjusting bedclothes, sheets and blankets)?: A Lot Difficulty moving from lying on back to sitting on the side of the bed? : A Lot Difficulty sitting down on and standing up from a chair with arms (e.g., wheelchair, bedside commode, etc,.)?: A Lot Help needed moving to and from a bed to chair (including a wheelchair)?: A Little Help needed walking in hospital room?: A Little Help needed climbing 3-5 steps with a railing? : A Little 6 Click Score: 15    End of Session Equipment Utilized During Treatment: Gait belt Activity Tolerance: Patient tolerated treatment well Patient left: in chair;with call bell/phone within reach;with family/visitor present;with chair alarm set   PT Visit Diagnosis: Muscle weakness (generalized) (M62.81);Difficulty in walking, not elsewhere classified (R26.2)    Time: 1004-1020 PT Time Calculation (min) (ACUTE ONLY): 16 min   Charges:   PT Evaluation $PT Eval Low Complexity: 1 Procedure     PT G Codes:          Rebeca Alert, MPT Pager: [phone]

## 2016-12-11 NOTE — Progress Notes (Signed)
Physical Therapy Treatment Patient Details Name: Caitlin Hancock MRN: [id number] DOB: 1945-09-30 Today's Date: 12/11/2016    History of Present Illness 71 yo female admitted after falling and sustaining a hip fx. S/P L THA-anterior approach.     PT Comments    Progressing with mobility. Plan is for d/c to a friend's home on tomorrow.    Follow Up Recommendations  Home health PT     Equipment Recommendations  None recommended by PT (pt stated she will borrow walker)    Recommendations for Other Services OT consult     Precautions / Restrictions Precautions Precautions: Fall Precaution Comments: no hip precautions Restrictions Weight Bearing Restrictions: No LLE Weight Bearing: Weight bearing as tolerated    Mobility  Bed Mobility Overal bed mobility: Needs Assistance Bed Mobility: Supine to Sit;Sit to Supine     Supine to sit: Supervision Sit to supine: Supervision   General bed mobility comments: HOB down, use of rails  Transfers Overall transfer level: Needs assistance Equipment used: Rolling walker (2 wheeled) Transfers: Sit to/from [employer] Sit to Stand: Min guard Stand pivot transfers: Min guard       General transfer comment: close guard for safety. VCs safety, hand/LE placement. Stand pivot, bed to bsc, with rW  Ambulation/Gait Ambulation/Gait assistance: Min guard Ambulation Distance (Feet): 150 Feet Assistive device: Rolling walker (2 wheeled) Gait Pattern/deviations: Step-to pattern;Step-through pattern;Decreased stride length     General Gait Details: close guard for safety. Cues for sequence initially.    Stairs            Wheelchair Mobility    Modified Rankin (Stroke Patients Only)       Balance Overall balance assessment: Needs assistance;History of Falls Sitting-balance support: No upper extremity supported;Feet supported Sitting balance-Leahy Scale: Good     Standing balance support: Bilateral upper  extremity supported;During functional activity Standing balance-Leahy Scale: Poor                              Cognition Arousal/Alertness: Awake/alert Behavior During Therapy: WFL for tasks assessed/performed Overall Cognitive Status: Within Functional Limits for tasks assessed                                        Exercises Total Joint Exercises Ankle Circles/Pumps: AROM;Both;10 reps;Supine Quad Sets: AROM;Both;10 reps;Supine Heel Slides: AAROM;Left;10 reps;Supine Hip ABduction/ADduction: AAROM;Left;Supine;10 reps    General Comments        Pertinent Vitals/Pain Pain Assessment: 0-10 Pain Score: 4  Pain Location: L LE Pain Descriptors / Indicators: Aching;Sore Pain Intervention(s): Monitored during session;Repositioned    Home Living Family/patient expects to be discharged to:: Private residence Living Arrangements: Non-relatives/Friends Available Help at Discharge: Friend(s) Type of Home: House Home Access: Stairs to enter Entrance Stairs-Rails: None Home Layout: Two level;Able to live on main level with bedroom/bathroom Home Equipment: Bedside commode Additional Comments: will borrow RW from friend (per pt)    Prior Function Level of Independence: Independent          PT Goals (current goals can now be found in the care plan section) Acute Rehab PT Goals Patient Stated Goal: regain independence PT Goal Formulation: With patient Time For Goal Achievement: 12/25/16 Potential to Achieve Goals: Good Progress towards PT goals: Progressing toward goals    Frequency    Min 5X/week      PT  Plan Current plan remains appropriate    Co-evaluation              AM-PAC PT "6 Clicks" Daily Activity  Outcome Measure  Difficulty turning over in bed (including adjusting bedclothes, sheets and blankets)?: A Little Difficulty moving from lying on back to sitting on the side of the bed? : A Little Difficulty sitting down on and  standing up from a chair with arms (e.g., wheelchair, bedside commode, etc,.)?: A Little Help needed moving to and from a bed to chair (including a wheelchair)?: A Little Help needed walking in hospital room?: A Little Help needed climbing 3-5 steps with a railing? : A Little 6 Click Score: 18    End of Session Equipment Utilized During Treatment: Gait belt Activity Tolerance: Patient tolerated treatment well Patient left: in bed;with call bell/phone within reach;with bed alarm set   PT Visit Diagnosis: Muscle weakness (generalized) (M62.81);Difficulty in walking, not elsewhere classified (R26.2)     Time: [id number] PT Time Calculation (min) (ACUTE ONLY): 16 min  Charges:  $Gait Training: 8-22 mins                    G Codes:          Rebeca AlertJannie Kairah Leoni, MPT Pager: [phone]

## 2016-12-11 NOTE — Op Note (Signed)
NAME:  Caitlin Hancock, Caitlin Hancock NO.:  [id number]  MEDICAL RECORD NO.:  [id number]  LOCATION:  WOTF                         FACILITY:  [facility]  PHYSICIAN:  Harvie Junior, M.D.   DATE OF BIRTH:  July 16, 1945  DATE OF PROCEDURE:  12/10/2016 DATE OF DISCHARGE:                              OPERATIVE REPORT   She is a 71 year old female in the Internal Medicine Service.  PREOPERATIVE DIAGNOSIS:  A femoral neck fracture with displacement, left.  POSTOPERATIVE DIAGNOSIS:  A femoral neck fracture with displacement, left.  PROCEDURE: 1. Left total hip replacement with a Corail stem size 12, 48-mm     Pinnacle porous-coated hole-less cup, 48-mm +4 neutral liner, and a     32-mm delta ceramic hip ball. 2. Interpretation of multiple intraoperative fluoroscopic images.  SURGEON:  Jodi Geralds, M.D.  ASSISTANTOrma Flaming.  ANESTHESIA:  Spinal.  BRIEF HISTORY:  Caitlin Hancock is a 71 year old very healthy female, who was visiting from [place].  She had flown in and ultimately got in the pool. She fell in the pool and fractured her hip.  She was seen and admitted by the Medicine Service and cleared for surgery.  X-ray showed a displaced femoral neck fracture.  We talked at length about treatment options, but given her young youthful age and high activity level, I felt that hip replacement would be the appropriate course of action. She was brought to operating room for this procedure.  DESCRIPTION OF PROCEDURE:  The patient was brought to the operating room.  After adequate anesthesia was obtained with spinal anesthetic, the patient was placed supine on the Hana bed.  Preoperative imaging was taken.  Following this, attention was turned to the left, hip which was prepped and draped in usual sterile fashion.  Following this, incision was made for an anterior approach to the hip, subcutaneous tissue, down the level of the tensor fascia.  The fascia was identified and finger dissected the  muscle.  Put retractors above and below the neck.  Opened the capsule and tagged it.  Made a provisional neck cut below the femoral neck fracture, which was displaced.  Removed this with a rongeur and then took a drill to remove the head.  Following this, attention was turned to the acetabulum, which was identified and sequentially reamed to a level of 47 mm.  A 48-mm porous-coated cup was placed under direct fluoroscopic vision, and once this was completed, a +4 neutral liner was placed.  Attention was then turned to the stem side, which was opened with a Cookie cutter followed by a rongeur and lateralization.  The canal was sequentially rasped up to a level of 12.  A 12 was used with a +0.  Provisional reduction was undertaken and perfect leg lengths had been achieved and excellent fit with the size 12 broach.  Following this, it was removed.  The size 12 stem was placed followed by a +0 ball followed by a +0 delta ceramic hip ball followed by trial reduction. Final images were taken and including pelvis films as well as hip films to ensure that we did have adequate leg length.  A trial manipulation with the leg  in 70 of extension and 90 of external rotation was trialed to make sure it was stable.  At this point, the capsule was closed with 1 Vicryl running, the tensor fascia with 0 Vicryl, skin with 0 and 2-0 Vicryl and 3-0 Monocryl subcuticular.  Benzoin and Steri-Strips were applied.  Sterile compressive dressing was applied.  The patient was taken to the recovery room and was noted to be in satisfactory condition.  Estimated blood loss for the procedure was 200 mL, but the final can be gotten from the anesthetic record.     Harvie JuniorJohn L. Rashanna Christiana, M.D.     Ranae PlumberJLG/MEDQ  D:  12/10/2016  T:  12/11/2016  Job:  [id number]

## 2016-12-12 LAB — BASIC METABOLIC PANEL
ANION GAP: 8 (ref 5–15)
BUN: 15 mg/dL (ref 6–20)
CHLORIDE: 100 mmol/L — AB (ref 101–111)
CO2: 30 mmol/L (ref 22–32)
Calcium: 8.7 mg/dL — ABNORMAL LOW (ref 8.9–10.3)
Creatinine, Ser: 0.78 mg/dL (ref 0.44–1.00)
GFR calc Af Amer: >60 mL/min (ref >60)
GLUCOSE: 112 mg/dL — AB (ref 65–99)
POTASSIUM: 3.8 mmol/L (ref 3.5–5.1)
Sodium: 138 mmol/L (ref 135–145)

## 2016-12-12 LAB — CBC
HEMATOCRIT: 34.5 % — AB (ref 36.0–46.0)
Hemoglobin: 11.8 g/dL — ABNORMAL LOW (ref 12.0–15.0)
MCH: 31.1 pg (ref 26.0–34.0)
MCHC: 34.2 g/dL (ref 30.0–36.0)
MCV: 90.8 fL (ref 78.0–100.0)
PLATELETS: 162 10*3/uL (ref 150–400)
RBC: 3.8 MIL/uL — ABNORMAL LOW (ref 3.87–5.11)
RDW: 13.2 % (ref 11.5–15.5)
WBC: 10.7 10*3/uL — AB (ref 4.0–10.5)

## 2016-12-12 MED ORDER — POLYETHYLENE GLYCOL 3350 17 G PO PACK: 17.0000 g | PACK | Freq: Every day | ORAL | 0 refills | Status: AC | PRN

## 2016-12-12 MED ORDER — ZOLPIDEM TARTRATE 5 MG PO TABS: 5.0000 mg | ORAL_TABLET | Freq: Every evening | ORAL | 0 refills | Status: AC | PRN

## 2016-12-12 NOTE — Discharge Summary (Signed)
Physician Discharge Summary  Caitlin StarchSusan Hancock ZOX:[id number] DOB: 1945-07-01 DOA: 12/09/2016  PCP: System, Pcp Not In  Admit date: 12/09/2016 Discharge date: 12/12/2016  Time spent: 35 minutes  Recommendations for Outpatient Follow-up:  1. PCP in 1 week 2. Ortho Dr.Graves in 2 weeks   Discharge Diagnoses:  Active Problems:   Closed left hip fracture [facility](HCC)   Essential hypertension   Hypothyroidism   Depression   Hip fracture [facility]HCC)   Discharge Condition: stable  Diet recommendation: regular  Filed Weights   12/09/16 1726  Weight: 80 kg (176 lb 4.8 oz)    History of present illness:  Pt. with PMH of hypertension, hypothyroidism, dyslipidemia; admitted on 12/09/2016, presented with complaint of fall, was found to have left femoral fracture.  Hospital Course:  1. Acute left femoral neck fracture with proximal migration of the femoral shaft and coxa vara angulation -Orthopedic surgery consulted, s/p TOTAL HIP ARTHROPLASTY ANTERIOR APPROACH (Left) 7/11 evening -pain controlled, PT eval completed, HHPT recommended and set up -DVt proph: started on ASA 325mg  BID per Ortho for 31month -stable for discharge today, FU with Ortho Dr.Graves in 2 weeks  2. Essential hypertension. Continue metoprolol.  3. Hypothyroidism. Continue Synthroid.  5. Depresison. Continue Wellbutrin.  Consultations:  Orthopedics   PROCEDURE: 1. Left total hip replacement with a Corail stem size 12, 48-mm     Pinnacle porous-coated hole-less cup, 48-mm +4 neutral liner, and a     32-mm delta ceramic hip ball. 2. Interpretation of multiple intraoperative fluoroscopic images.  Discharge Exam: Vitals:   12/11/16 2051 12/12/16 0435  BP: (!) 124/49 (!) 122/53  Pulse: 83 75  Resp: 17 16  Temp: 98.8 F (37.1 C) 98.8 F (37.1 C)    General: AAOx3 Cardiovascular: S1S2/RRR Respiratory: CTAB  Discharge Instructions   Discharge Instructions    Diet - low sodium heart healthy    Complete by:  As  directed    Weight bearing as tolerated    Complete by:  As directed    No hip precautions   Laterality:  left   Extremity:  Lower     Current Discharge Medication List    START taking these medications   Details  aspirin EC 325 MG tablet Take 1 tablet (325 mg total) by mouth 2 (two) times daily after a meal. Take x 1 month post op to decrease risk of blood clots. Qty: 60 tablet, Refills: 0    HYDROcodone-acetaminophen (NORCO) 5-325 MG tablet Take 1-2 tablets by mouth every 6 (six) hours as needed for moderate pain. Qty: 50 tablet, Refills: 0    polyethylene glycol (MIRALAX / GLYCOLAX) packet Take 17 g by mouth daily as needed for mild constipation. Qty: 14 each, Refills: 0    zolpidem (AMBIEN) 5 MG tablet Take 1 tablet (5 mg total) by mouth at bedtime as needed for sleep. Qty: 15 tablet, Refills: 0      CONTINUE these medications which have NOT CHANGED   Details  buPROPion (WELLBUTRIN SR) 150 MG 12 hr tablet bupropion HCl SR 150 mg tablet,12 hr sustained-release  Take 1 tablet every day by oral route.    Calcium-Phosphorus-Vitamin D (CALCIUM/VITAMIN D3/ADULT GUMMY) 250-100-500 MG-MG-UNIT CHEW Chew 1 tablet by mouth daily.    co-enzyme Q-10 30 MG capsule Take 30 mg by mouth daily.    Cyanocobalamin (VITAMIN B-12 PO) Take 1 tablet by mouth daily.    estradiol (ESTRACE VAGINAL) 0.1 MG/GM vaginal cream Estrace 0.01% (0.1 mg/gram) vaginal cream  Insert 1 g 3 times  a week by vaginal route for 90 days.    gabapentin (NEURONTIN) 100 MG capsule gabapentin 100 mg capsule  Take 1 capsule every day by oral route at bedtime for 1 day.    hydrochlorothiazide (HYDRODIURIL) 25 MG tablet Take 25 mg by mouth 3 (three) times a week.    levothyroxine (SYNTHROID) 100 MCG tablet Synthroid 100 mcg tablet  Take 1 tablet every day by oral route.    losartan (COZAAR) 50 MG tablet losartan 50 mg tablet  Take 1 tablet every day by oral route for 90 days.    Magnesium 400 MG CAPS Take 1  capsule by mouth daily.    metoprolol tartrate (LOPRESSOR) 25 MG tablet metoprolol tartrate 25 mg tablet  Take 1 tablet twice a day by oral route.    omeprazole (PRILOSEC) 40 MG capsule omeprazole 40 mg capsule,delayed release  Take 1 capsule every day by oral route for 90 days.    triamcinolone cream (KENALOG) 0.1 % triamcinolone acetonide 0.1 % topical cream  APPLY TO AFFECTED DAILY AS NEEDED FOR ITCHING    albuterol (PROAIR HFA) 108 (90 Base) MCG/ACT inhaler ProAir HFA 90 mcg/actuation aerosol inhaler  INHALE TWO PUFFS BY MOUTH FOUR TIMES A DAY AS NEEDED FOR COUGH      STOP taking these medications     temazepam (RESTORIL) 30 MG capsule        No Known Allergies Follow-up Information    Jodi Geralds, MD. Schedule an appointment as soon as possible for a visit in 2 week(s).   Specialty:  Orthopedic Surgery Why:  Or FOLLOW UP WITH ORTHO IN HER HOMETOWN. Contact information: [address] [place] [address] [phone]            The results of significant diagnostics from this hospitalization (including imaging, microbiology, ancillary and laboratory) are listed below for reference.    Significant Diagnostic Studies: Dg Chest 1 View  Result Date: 12/09/2016 CLINICAL DATA:  Larey Seat today with left hip pain EXAM: CHEST 1 VIEW COMPARISON:  None. FINDINGS: No active infiltrate or effusion is seen. Mediastinal and hilar contours are unremarkable. The heart is within upper limits of normal. No acute bony abnormality is seen. There appears to be old resection of the distal left clavicle. IMPRESSION: No active lung disease.  No acute abnormality. Electronically Signed   By: Dwyane Dee M.D.   On: 12/09/2016 16:07   Dg C-arm 1-60 Min  Result Date: 12/10/2016 CLINICAL DATA:  Left hip arthroplasty EXAM: OPERATIVE LEFT HIP (WITH PELVIS IF PERFORMED) 4 VIEWS TECHNIQUE: Fluoroscopic spot image(s) were submitted for interpretation post-operatively. COMPARISON:  None. FLUOROSCOPY  TIME:  16 seconds FINDINGS: Intraoperative fluoroscopic images during left hip arthroplasty. Left hip arthroplasty in satisfactory position. No fracture or dislocation is seen. IMPRESSION: Intraoperative fluoroscopic images during left hip arthroplasty. Electronically Signed   By: Charline Bills M.D.   On: 12/10/2016 19:43   Dg Hip Operative Unilat With Pelvis Left  Result Date: 12/10/2016 CLINICAL DATA:  Left hip arthroplasty EXAM: OPERATIVE LEFT HIP (WITH PELVIS IF PERFORMED) 4 VIEWS TECHNIQUE: Fluoroscopic spot image(s) were submitted for interpretation post-operatively. COMPARISON:  None. FLUOROSCOPY TIME:  16 seconds FINDINGS: Intraoperative fluoroscopic images during left hip arthroplasty. Left hip arthroplasty in satisfactory position. No fracture or dislocation is seen. IMPRESSION: Intraoperative fluoroscopic images during left hip arthroplasty. Electronically Signed   By: Charline Bills M.D.   On: 12/10/2016 19:43   Dg Hip Unilat W Or Wo Pelvis 2-3 Views Left  Result Date: 12/09/2016  CLINICAL DATA:  71 y/o  F; fall with left hip pain. EXAM: DG HIP (WITH OR WITHOUT PELVIS) 2-3V LEFT COMPARISON:  None. FINDINGS: Acute left femoral neck fracture with proximal migration of the femoral shaft and coxa vara angulation. No hip dislocation. No pelvic fracture identified. IMPRESSION: Acute left femoral neck fracture with proximal migration of the femoral shaft and coxa vara angulation. Electronically Signed   By: Mitzi Hansen M.D.   On: 12/09/2016 16:10    Microbiology: Recent Results (from the past 240 hour(s))  Surgical pcr screen     Status: None   Collection Time: 12/09/16 11:11 PM  Result Value Ref Range Status   MRSA, PCR NEGATIVE NEGATIVE Final   Staphylococcus aureus NEGATIVE NEGATIVE Final    Comment:        The Xpert SA Assay (FDA approved for NASAL specimens in patients over 72 years of age), is one component of a comprehensive surveillance program.  Test  performance has been validated by [facility] for patients greater than or equal to 39 year old. It is not intended to diagnose infection nor to guide or monitor treatment.      Labs: Basic Metabolic Panel:  Recent Labs Lab 12/09/16 1518 12/10/16 0452 12/11/16 0352 12/11/16 0403 12/12/16 0424  NA 140 139  --  136 138  K 3.8 3.6  --  3.7 3.8  CL 103 100*  --  101 100*  CO2 27 30  --  26 30  GLUCOSE 95 115*  --  271* 112*  BUN 22* 16  --  14 15  CREATININE 1.02* 0.88  --  0.83 0.78  CALCIUM 9.2 9.1  --  8.4* 8.7*  MG  --   --  1.8  --   --    Liver Function Tests:  Recent Labs Lab 12/09/16 1518  AST 21  ALT 13*  ALKPHOS 60  BILITOT 0.7  PROT 6.6  ALBUMIN 4.3   No results for input(s): LIPASE, AMYLASE in the last 168 hours. No results for input(s): AMMONIA in the last 168 hours. CBC:  Recent Labs Lab 12/09/16 1518 12/10/16 0452 12/11/16 0403 12/12/16 0424  WBC 7.8 8.0 8.2 10.7*  NEUTROABS 5.8  --   --   --   HGB 13.5 12.9 11.4* 11.8*  HCT 39.8 38.2 33.8* 34.5*  MCV 90.2 91.2 90.9 90.8  PLT 190 188 158 162   Cardiac Enzymes: No results for input(s): CKTOTAL, CKMB, CKMBINDEX, TROPONINI in the last 168 hours. BNP: BNP (last 3 results) No results for input(s): BNP in the last 8760 hours.  ProBNP (last 3 results) No results for input(s): PROBNP in the last 8760 hours.  CBG:  Recent Labs Lab 12/09/16 1501  GLUCAP 97       Signed:  Eloni Darius MD.  [facility] 12/12/2016, 1:43 PM

## 2016-12-12 NOTE — Progress Notes (Signed)
Physical Therapy Treatment Patient Details Name: Caitlin Hancock MRN: [id number] DOB: 07/01/1945 Today's Date: 12/12/2016    History of Present Illness 71 yo female admitted after falling and sustaining a hip fx. S/P L THA-anterior approach.     PT Comments    POD # 1 Pt is from [place] and was visiting a friend when she fell trying to get out of a pool.   Assisted OOB to amb in hallway, practiced stairs then returned to room to perform TE's.  Pt instructed on proper tech, freq as well as use of ICE.  Pt instructed on how to negotiate a one step/curb.  Hnadouts alos given on HEP, stairs with one crutch/one rail and negotiating one step. Pt plans to stay with [place] friend for a few weeks.     Follow Up Recommendations  Home health PT     Equipment Recommendations  None recommended by PT (one crutch issued for stairs and plans to borrow her friends walker)    Recommendations for Other Services       Precautions / Restrictions Precautions Precautions: Fall Precaution Comments: no hip precautions Restrictions Weight Bearing Restrictions: No LLE Weight Bearing: Weight bearing as tolerated    Mobility  Bed Mobility Overal bed mobility: Needs Assistance       Supine to sit: Supervision Sit to supine: Min assist   General bed mobility comments: more assist needed up onto bed  Transfers Overall transfer level: Needs assistance Equipment used: Rolling walker (2 wheeled) Transfers: Sit to/from [employer] Sit to Stand: Supervision;Min guard Stand pivot transfers: Supervision;Min guard       General transfer comment: 25% VC's on proper hand placement and safety with turns.    Ambulation/Gait Ambulation/Gait assistance: Supervision;Min guard Ambulation Distance (Feet): 65 Feet Assistive device: Rolling walker (2 wheeled) Gait Pattern/deviations: Step-to pattern;Step-through pattern;Decreased stride length Gait velocity: decreased   General Gait Details: <25%  VC's on proper walker to self distance and safety with turns   Stairs Stairs: Yes   Stair Management: One rail Right;Step to pattern;Forwards;With crutches Number of Stairs: 4 General stair comments: 50% VC's on proper sequencing, one rail/one crutch use and safety.    Wheelchair Mobility    Modified Rankin (Stroke Patients Only)       Balance                                            Cognition Arousal/Alertness: Awake/alert Behavior During Therapy: WFL for tasks assessed/performed Overall Cognitive Status: Within Functional Limits for tasks assessed                                        Exercises   Total Hip Replacement TE's 10 reps ankle pumps 10 reps knee presses 10 reps heel slides 10 reps SAQ's 10 reps ABD Followed by ICE     General Comments        Pertinent Vitals/Pain Pain Assessment: 0-10 Pain Score: 5  Pain Location: L hip Pain Descriptors / Indicators: Aching;Sore;Operative site guarding Pain Intervention(s): Monitored during session;Repositioned;Ice applied    Home Living                      Prior Function            PT Goals (  current goals can now be found in the care plan section) Progress towards PT goals: Progressing toward goals    Frequency    Min 5X/week      PT Plan Current plan remains appropriate    Co-evaluation              AM-PAC PT "6 Clicks" Daily Activity  Outcome Measure  Difficulty turning over in bed (including adjusting bedclothes, sheets and blankets)?: Total Difficulty moving from lying on back to sitting on the side of the bed? : Total Difficulty sitting down on and standing up from a chair with arms (e.g., wheelchair, bedside commode, etc,.)?: Total Help needed moving to and from a bed to chair (including a wheelchair)?: A Lot Help needed walking in hospital room?: A Lot Help needed climbing 3-5 steps with a railing? : A Lot 6 Click Score: 9    End  of Session Equipment Utilized During Treatment: Gait belt Activity Tolerance: Patient tolerated treatment well Patient left: in bed;with call bell/phone within reach (chair hurts my back ) Nurse Communication: Mobility status PT Visit Diagnosis: Muscle weakness (generalized) (M62.81);Difficulty in walking, not elsewhere classified (R26.2)     Time: [id number] PT Time Calculation (min) (ACUTE ONLY): 43 min  Charges:  $Gait Training: 8-22 mins $Therapeutic Exercise: 8-22 mins $Therapeutic Activity: 8-22 mins                    G Codes:       Felecia ShellingLori Nadalyn Deringer  PTA [facility]  Acute  Rehab Pager      [phone]

## 2016-12-12 NOTE — Progress Notes (Signed)
Kindered at home aware of pt's discharge.

## 2016-12-12 NOTE — Progress Notes (Signed)
Subjective: 2 Days Post-Op Procedure(s) (LRB): TOTAL HIP ARTHROPLASTY ANTERIOR APPROACH (Left) Patient reports pain as mild.  Ambulated 150 feet with physical therapy yesterday. Taking fluids by mouth. Voiding okay. No BM yet. Positive flatus. She is ready to be discharged to her friend's house locally.  Objective: Vital signs in last 24 hours: Temp:  [97.9 F (36.6 C)-98.8 F (37.1 C)] 98.8 F (37.1 C) (07/13 0435) Pulse Rate:  [75-83] 75 (07/13 0435) Resp:  [16-18] 16 (07/13 0435) BP: (122-129)/(49-59) 122/53 (07/13 0435) SpO2:  [95 %-97 %] 96 % (07/13 0435)  Intake/Output from previous day: 07/12 0701 - 07/13 0700 In: 1238.8 [P.O.:720; I.V.:518.8] Out: 1360 [Urine:1360] Intake/Output this shift: No intake/output data recorded.   Recent Labs  12/09/16 1518 12/10/16 0452 12/11/16 0403 12/12/16 0424  HGB 13.5 12.9 11.4* 11.8*    Recent Labs  12/11/16 0403 12/12/16 0424  WBC 8.2 10.7*  RBC 3.72* 3.80*  HCT 33.8* 34.5*  PLT 158 162    Recent Labs  12/11/16 0403 12/12/16 0424  NA 136 138  K 3.7 3.8  CL 101 100*  CO2 26 30  BUN 14 15  CREATININE 0.83 0.78  GLUCOSE 271* 112*  CALCIUM 8.4* 8.7*   No results for input(s): LABPT, INR in the last 72 hours. Left hip exam: Neurologically intact Neurovascular intact Sensation intact distally Intact pulses distally Dorsiflexion/Plantar flexion intact Incision: dressing C/D/I Compartment soft  Assessment/Plan: 2 Days Post-Op Procedure(s) (LRB): TOTAL HIP ARTHROPLASTY ANTERIOR APPROACH (Left) for left femoral neck fracture. Plan: Aspirin 325 mg twice daily 1 month for DVT prophylaxis. Weight-bear as tolerated on left without hip precautions. Rx for Norco 5 mg as needed for pain. Use sparingly. Up with therapy Discharge home with home health Will need follow-up Dr. Luiz BlareGraves in the office in 2 weeks.  Arloa Prak G 12/12/2016, 7:46 AM

## 2016-12-17 ENCOUNTER — Emergency Department (HOSPITAL_COMMUNITY): Payer: Medicare Other

## 2016-12-17 ENCOUNTER — Emergency Department (HOSPITAL_COMMUNITY)
Admission: EM | Admit: 2016-12-17 | Discharge: 2016-12-17 | Disposition: A | Discharge status: Home / Self Care | Payer: Medicare Other | Attending: Emergency Medicine | Admitting: Emergency Medicine

## 2016-12-17 ENCOUNTER — Emergency Department (HOSPITAL_BASED_OUTPATIENT_CLINIC_OR_DEPARTMENT_OTHER)
Admit: 2016-12-17 | Discharge: 2016-12-17 | Disposition: A | Discharge status: Home / Self Care | Payer: Medicare Other | Attending: Emergency Medicine | Admitting: Emergency Medicine

## 2016-12-17 ENCOUNTER — Encounter (HOSPITAL_COMMUNITY): Payer: Self-pay | Admitting: Obstetrics and Gynecology

## 2016-12-17 DIAGNOSIS — Z7982 Long term (current) use of aspirin: Secondary | ICD-10-CM | POA: Diagnosis not present

## 2016-12-17 DIAGNOSIS — Z96659 Presence of unspecified artificial knee joint: Secondary | ICD-10-CM | POA: Diagnosis not present

## 2016-12-17 DIAGNOSIS — I10 Essential (primary) hypertension: Secondary | ICD-10-CM | POA: Diagnosis not present

## 2016-12-17 DIAGNOSIS — E039 Hypothyroidism, unspecified: Secondary | ICD-10-CM | POA: Insufficient documentation

## 2016-12-17 DIAGNOSIS — Z87891 Personal history of nicotine dependence: Secondary | ICD-10-CM | POA: Insufficient documentation

## 2016-12-17 DIAGNOSIS — R0602 Shortness of breath: Secondary | ICD-10-CM | POA: Insufficient documentation

## 2016-12-17 DIAGNOSIS — R2242 Localized swelling, mass and lump, left lower limb: Secondary | ICD-10-CM | POA: Insufficient documentation

## 2016-12-17 DIAGNOSIS — Z96642 Presence of left artificial hip joint: Secondary | ICD-10-CM | POA: Diagnosis not present

## 2016-12-17 DIAGNOSIS — M7989 Other specified soft tissue disorders: Secondary | ICD-10-CM

## 2016-12-17 DIAGNOSIS — R079 Chest pain, unspecified: Secondary | ICD-10-CM | POA: Diagnosis present

## 2016-12-17 LAB — BASIC METABOLIC PANEL
ANION GAP: 8 (ref 5–15)
BUN: 27 mg/dL — ABNORMAL HIGH (ref 6–20)
CALCIUM: 8.7 mg/dL — AB (ref 8.9–10.3)
CHLORIDE: 105 mmol/L (ref 101–111)
CO2: 25 mmol/L (ref 22–32)
Creatinine, Ser: 0.72 mg/dL (ref 0.44–1.00)
GFR calc non Af Amer: >60 mL/min (ref >60)
GLUCOSE: 123 mg/dL — AB (ref 65–99)
POTASSIUM: 4.3 mmol/L (ref 3.5–5.1)
Sodium: 138 mmol/L (ref 135–145)

## 2016-12-17 LAB — TROPONIN I: Troponin I: <0.03 ng/mL (ref <0.03)

## 2016-12-17 LAB — CBC WITH DIFFERENTIAL/PLATELET
BASOS ABS: 0.1 10*3/uL (ref 0.0–0.1)
BASOS PCT: 1 %
Eosinophils Absolute: 0.2 10*3/uL (ref 0.0–0.7)
Eosinophils Relative: 3 %
HEMATOCRIT: 31.6 % — AB (ref 36.0–46.0)
HEMOGLOBIN: 10.5 g/dL — AB (ref 12.0–15.0)
LYMPHS PCT: 19 %
Lymphs Abs: 1.3 10*3/uL (ref 0.7–4.0)
MCH: 30.3 pg (ref 26.0–34.0)
MCHC: 33.2 g/dL (ref 30.0–36.0)
MCV: 91.1 fL (ref 78.0–100.0)
MONO ABS: 0.5 10*3/uL (ref 0.1–1.0)
Monocytes Relative: 8 %
NEUTROS ABS: 4.7 10*3/uL (ref 1.7–7.7)
NEUTROS PCT: 69 %
Platelets: 307 10*3/uL (ref 150–400)
RBC: 3.47 MIL/uL — AB (ref 3.87–5.11)
RDW: 13.1 % (ref 11.5–15.5)
WBC: 6.8 10*3/uL (ref 4.0–10.5)

## 2016-12-17 LAB — D-DIMER, QUANTITATIVE: D-Dimer, Quant: 2.42 ug/mL-FEU — ABNORMAL HIGH (ref 0.00–0.50)

## 2016-12-17 LAB — I-STAT TROPONIN, ED: Troponin i, poc: 0.01 ng/mL (ref 0.00–0.08)

## 2016-12-17 MED ORDER — IOPAMIDOL (ISOVUE-370) INJECTION 76%
100.0000 mL | Freq: Once | INTRAVENOUS | Status: AC | PRN
  Administered 2016-12-17: 73 mL via INTRAVENOUS

## 2016-12-17 MED ORDER — SODIUM CHLORIDE 0.9 % IV SOLN
INTRAVENOUS | Status: DC
  Administered 2016-12-17: 13:00:00 via INTRAVENOUS

## 2016-12-17 MED ORDER — IOPAMIDOL (ISOVUE-370) INJECTION 76%
INTRAVENOUS | Status: AC
  Filled 2016-12-17: qty 100

## 2016-12-17 NOTE — ED Provider Notes (Signed)
[facility]-EMERGENCY DEPT Provider Note   CSN: [id number] Arrival date & time: 12/17/16  1151     History   Chief Complaint Chief Complaint  Patient presents with  . Shortness of Breath  . Chest Wall Pain    HPI Loretha Ure is a 71 y.o. female.  HPI Pt comes in with cc of chest pain and dib. Pt has hx of HTN and a recent L sided hip replacement. She was getting PT today when she got short of breath. Pt started having chest pain yday. Chest pain is constant and midsternal, non radiating, dull in nature. Pain is not worse with position, pt hasnt had much exertion. Pt has no hx of PE, DVT, she does use topical estrogen. Pt has no hx of CAD.  Past Medical History:  Diagnosis Date  . Arthritis   . Depression   . Hypertension     Patient Active Problem List   Diagnosis Date Noted  . Closed left hip fracture (HCC) 12/09/2016  . Essential hypertension 12/09/2016  . Hypothyroidism 12/09/2016  . Depression 12/09/2016  . Hip fracture (HCC) 12/09/2016    Past Surgical History:  Procedure Laterality Date  . bilateral cataract extractions    . FOOT SURGERY    . LAMINECTOMY    . left achilles tendon repair    . REPLACEMENT TOTAL KNEE    . SHOULDER SURGERY    . TOTAL HIP ARTHROPLASTY Left 12/10/2016   Procedure: TOTAL HIP ARTHROPLASTY ANTERIOR APPROACH;  Surgeon: Jodi Geralds, MD;  Location: [facility] ORS;  Service: Orthopedics;  Laterality: Left;  . TUBAL LIGATION      OB History    No data available       Home Medications    Prior to Admission medications   Medication Sig Start Date End Date Taking? Authorizing Provider  aspirin EC 325 MG tablet Take 1 tablet (325 mg total) by mouth 2 (two) times daily after a meal. Take x 1 month post op to decrease risk of blood clots. 12/10/16  Yes Marshia Ly, PA-C  buPROPion [facility] SR) 150 MG 12 hr tablet take 150mg  by mouth daily   Yes [provider]  co-enzyme Q-10 30 MG capsule Take 30 mg by mouth daily.   Yes [provider]  Cyanocobalamin (VITAMIN B-12 PO) Take 1 tablet by mouth daily.   Yes [provider]  docusate sodium (COLACE) 100 MG capsule Take 100 mg by mouth 2 (two) times daily.   Yes [provider]  gabapentin (NEURONTIN) 100 MG capsule take 100mg  by mouth at bedtime   Yes [provider]  hydrochlorothiazide (HYDRODIURIL) 25 MG tablet Take 25 mg by mouth 3 (three) times a week. Monday, Wednesday, Friday   Yes [provider]  HYDROcodone-acetaminophen (NORCO) 5-325 MG tablet Take 1-2 tablets by mouth every 6 (six) hours as needed for moderate pain. 12/10/16  Yes Marshia Ly, PA-C  levothyroxine (SYNTHROID) 100 MCG tablet take by mouth daily   Yes [provider]  losartan (COZAAR) 50 MG tablet take 50mg  by mouth daily   Yes [provider]  Magnesium 400 MG CAPS Take 400 mg by mouth daily.    Yes [provider]  metoprolol tartrate (LOPRESSOR) 25 MG tablet take 25mg  by mouth twice daily   Yes [provider]  omeprazole (PRILOSEC) 40 MG capsule take 40mg  by mout daily   Yes [provider]  SERTRALINE HCL PO Take 100 mg by mouth daily.    Yes  [provider]  temazepam (RESTORIL) 30 MG capsule Take 30 mg by mouth at bedtime.   Yes [provider]  triamcinolone cream (KENALOG) 0.1 % triamcinolone acetonide 0.1 % topical cream  APPLY TO AFFECTED DAILY AS NEEDED FOR ITCHING   Yes [provider]  polyethylene glycol (MIRALAX / GLYCOLAX) packet Take 17 g by mouth daily as needed for mild constipation. Patient not taking: Reported on 12/17/2016 12/12/16   Zannie Cove, MD  zolpidem (AMBIEN) 5 MG tablet Take 1 tablet (5 mg total) by mouth at bedtime as needed for sleep. Patient not taking: Reported on 12/17/2016 12/12/16   Zannie Cove, MD    Family History History reviewed. No pertinent family history.  Social History Social History  Substance Use Topics  . Smoking  status: Former Games developer  . Smokeless tobacco: Never Used  . Alcohol use Yes     Allergies   Patient has no known allergies.   Review of Systems Review of Systems  Respiratory: Positive for shortness of breath.   Cardiovascular: Positive for chest pain.  All other systems reviewed and are negative.    Physical Exam Updated Vital Signs BP 121/61 (BP Location: Right Arm)   Pulse 65   Resp 18   Ht 5\' 5"  (1.651 m)   Wt 79.8 kg (176 lb)   SpO2 99%   BMI 29.29 kg/m   Physical Exam  Constitutional: She is oriented to person, place, and time. She appears well-developed and well-nourished.  HENT:  Head: Normocephalic and atraumatic.  Eyes: Pupils are equal, round, and reactive to light. EOM are normal.  Neck: Neck supple.  Cardiovascular: Normal rate, regular rhythm and normal heart sounds.   No murmur heard. Pulmonary/Chest: Effort normal. No respiratory distress. She has no wheezes.  Abdominal: Soft. She exhibits no distension. There is no tenderness. There is no rebound and no guarding.  Musculoskeletal: She exhibits edema.  LLE unilateral swelling  Neurological: She is alert and oriented to person, place, and time.  Skin: Skin is warm and dry.  Nursing note and vitals reviewed.    ED Treatments / Results  Labs (all labs ordered are listed, but only abnormal results are displayed) Labs Reviewed  CBC WITH DIFFERENTIAL/PLATELET - Abnormal; Notable for the following:       Result Value   RBC 3.47 (*)    Hemoglobin 10.5 (*)    HCT 31.6 (*)    All other components within normal limits  BASIC METABOLIC PANEL - Abnormal; Notable for the following:    Glucose, Bld 123 (*)    BUN 27 (*)    Calcium 8.7 (*)    All other components within normal limits  TROPONIN I  D-DIMER, QUANTITATIVE (NOT AT [facility])  Rosezena Sensor, ED    EKG  EKG Interpretation  Date/Time:  Wednesday December 17 2016 16:39:38 EDT Ventricular Rate:  65 PR Interval:    QRS Duration: 89 QT  Interval:  425 QTC Calculation: 442 R Axis:   61 Text Interpretation:  Sinus rhythm No acute changes Confirmed by Derwood Kaplan ([id number]) on 12/17/2016 5:45:44 PM       Radiology Dg Chest 2 View  Result Date: 12/17/2016 CLINICAL DATA:  Left chest pain and shortness of breath since physical therapy 12/15/2016. EXAM: CHEST  2 VIEW COMPARISON:  Single-view of the chest 12/09/2016. FINDINGS: The lungs are clear. Heart size is normal. No pneumothorax or pleural fluid. No acute bony abnormality. Postoperative change left shoulder noted. IMPRESSION: No acute disease. Electronically  Signed   By: Drusilla Kannerhomas  Dalessio M.D.   On: 12/17/2016 13:15   Ct Angio Chest Pe W And/or Wo Contrast  Result Date: 12/17/2016 CLINICAL DATA:  Shortness of breath, chest wall pain EXAM: CT ANGIOGRAPHY CHEST WITH CONTRAST TECHNIQUE: Multidetector CT imaging of the chest was performed using the standard protocol during bolus administration of intravenous contrast. Multiplanar CT image reconstructions and MIPs were obtained to evaluate the vascular anatomy. CONTRAST:  73 mL Isovue 370 IV COMPARISON:  Chest radiographs dated 12/17/2016 FINDINGS: Cardiovascular: Satisfactory opacification the bilateral pulmonary artery to the segmental level. No evidence of pulmonary embolism. No evidence of thoracic aortic aneurysm or dissection. The heart is normal in size.  No pericardial effusion. Mediastinum/Nodes: No suspicious mediastinal lymphadenopathy. Visualized thyroid is unremarkable. Lungs/Pleura: Scattered tree in bud and subpleural nodularity in the lungs bilaterally, with all nodules measuring less than 5 mm, favored to reflect mild infection. No pleural effusion or pneumothorax. Upper Abdomen: Visualized upper abdomen is unremarkable. Musculoskeletal: Visualized osseous structures are within normal limits. Review of the MIP images confirms the above findings. IMPRESSION: No evidence of pulmonary embolism. Scattered nodularity in the lungs  bilaterally, favoring mild infection. Electronically Signed   By: Charline BillsSriyesh  Krishnan M.D.   On: 12/17/2016 17:22    Procedures Procedures (including critical care time)  Medications Ordered in ED Medications  0.9 %  sodium chloride infusion ( Intravenous Stopped 12/17/16 1653)  iopamidol (ISOVUE-370) 76 % injection (  Hold 12/17/16 1733)  iopamidol (ISOVUE-370) 76 % injection 100 mL (73 mLs Intravenous Contrast Given 12/17/16 1702)     Initial Impression / Assessment and Plan / ED Course  I have reviewed the triage vital signs and the nursing notes.  Pertinent labs & imaging results that were available during my care of the patient were reviewed by me and considered in my medical decision making (see chart for details).     Pt comes in with cc of chest pain, dib. She uses exogenous estrogen and had a recent hip replacement. Moreover, pt has LLE unilateral swelling as well.  CT PE is neg. US DVT is neg.  Pt has no CAD hx and reports that she had a neg stress in April, 2017. Pt's chest pain is constant, dull and not typical for ACS. We will still get trops x 2.  6:27 PM Results from the ER workup discussed with the patient face to face and all questions answered to the best of my ability.  Strict ER return precautions have been discussed, and patient is agreeing with the plan and is comfortable with the workup done and the recommendations from the ER.   Final Clinical Impressions(s) / ED Diagnoses   Final diagnoses:  SOB (shortness of breath)  Chest pain, unspecified type    New Prescriptions New Prescriptions   No medications on file     Derwood KaplanNanavati, Chaslyn Eisen, MD 12/17/16 1827

## 2016-12-17 NOTE — ED Notes (Signed)
Bed: WA02 Expected date:  Expected time:  Means of arrival:  Comments: EMS/r/o p.e.

## 2016-12-17 NOTE — ED Triage Notes (Signed)
Per EMS: Pt is coming from a friends house Pt reports shortness of breathe, chest wall pain. Pt recently had surgery on the 12th and is concerned about a PE.  Pt reports dull pain 2/10 that started on Monday night. Pt has been getting PT and the pain started after a session of PT.  Pt has a 20g in her LAC. Pt had hydrocodone at 1000.  No cardiac history.

## 2016-12-17 NOTE — Progress Notes (Signed)
**  Preliminary report by tech**  Left lower extremity venous duplex complete. There is no evidence of deep or superficial vein thrombosis involving the left lower extremity. All visualized vessels appear patent and compressible. There is no evidence of a Baker's cyst on the left. Results were given to Dr. Rhunette CroftNanavati.  12/17/16 5:57 PM Olen CordialGreg Krithika Tome RVT

## 2016-12-17 NOTE — ED Notes (Signed)
ED Provider at bedside. 

## 2016-12-17 NOTE — Discharge Instructions (Signed)
We saw you in the ER for the chest pain/shortness of breath. All of our cardiac workup is normal, including labs, EKG and chest X-RAY are normal. We also evaluated you for blood clots, and the CT scan and the ultrasound are negative. We are not sure what is causing your discomfort, but we feel comfortable sending you home at this time. The workup in the ER is not complete, and you should follow up with your primary care doctor for further evaluation.  Please return to the ER if you have worsening chest pain, shortness of breath, pain radiating to your jaw, shoulder, or back, sweats or fainting. Otherwise see the Cardiologist or your primary care doctor as requested.

## 2018-05-20 IMAGING — CT CT ANGIO CHEST
3 of 7 series · 19 of 36 positions shown · IV contrast (ISOVUE 370)
Comparison: Chest radiographs dated 12/17/2016

CLINICAL DATA: Shortness of breath, chest wall pain

EXAM:
CT ANGIOGRAPHY CHEST WITH CONTRAST
TECHNIQUE: Multidetector CT imaging of the chest was performed using the
standard protocol during bolus administration of intravenous
contrast. Multiplanar CT image reconstructions and MIPs were
obtained to evaluate the vascular anatomy.
CONTRAST:  73 mL Isovue 370 IV

[Series 5: coronal mpr · coronal · 0.61mm/px · 1 of 115 slices shown]
[im 58/115  mediastinal]
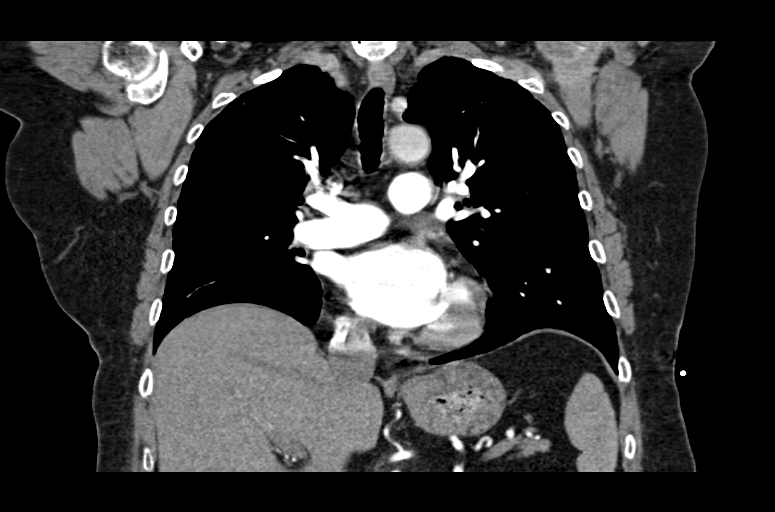

[Series 10: thins for pacs · axial · 0.74mm/px · z∈[-156,+72]mm · 15 of 262 slices shown]
[im 17/262  lung]
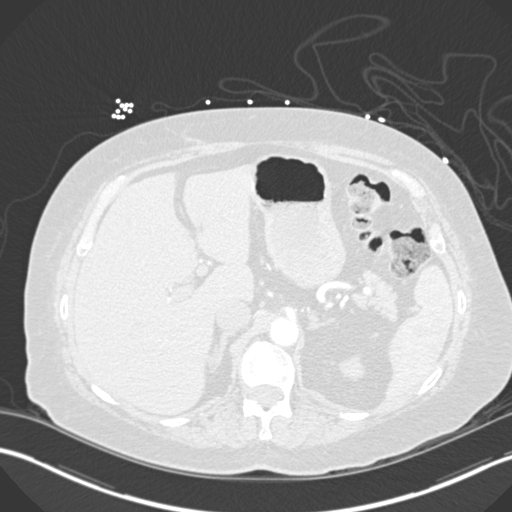
[im 33/262  mediastinal]
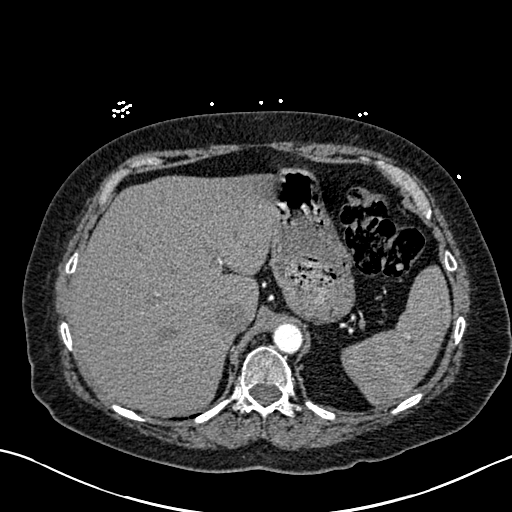
[im 49/262  lung]
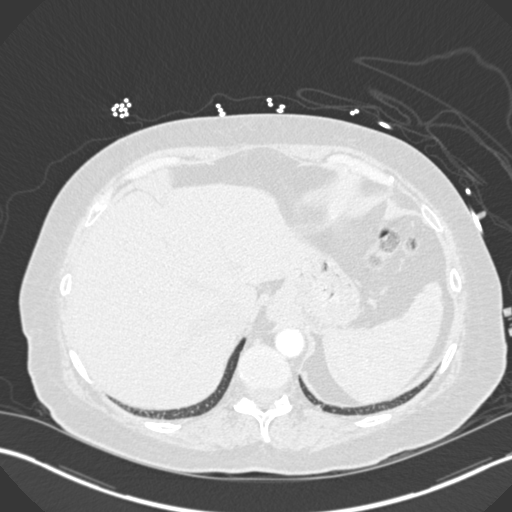
[im 66/262  mediastinal]
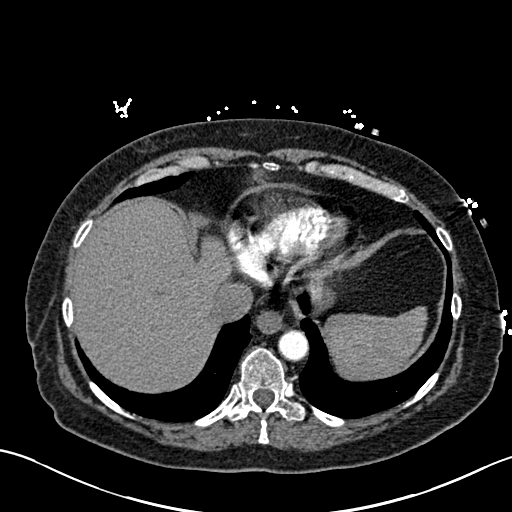
[im 82/262  lung]
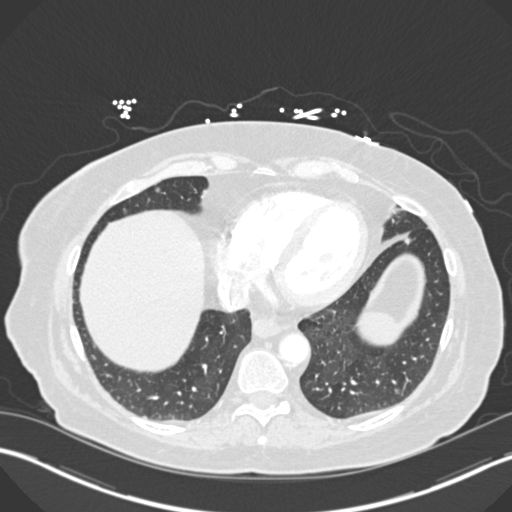
[im 98/262  mediastinal]
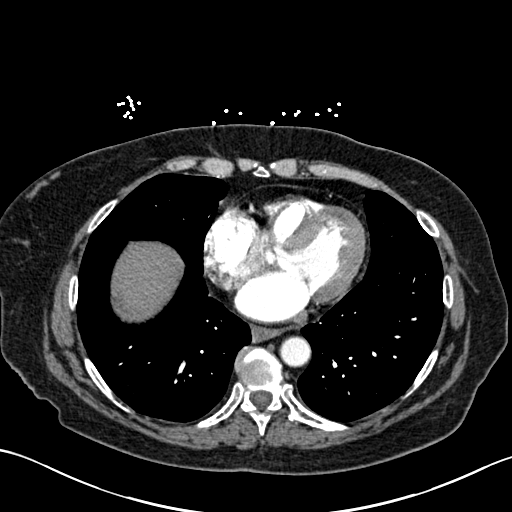
[im 115/262  lung]
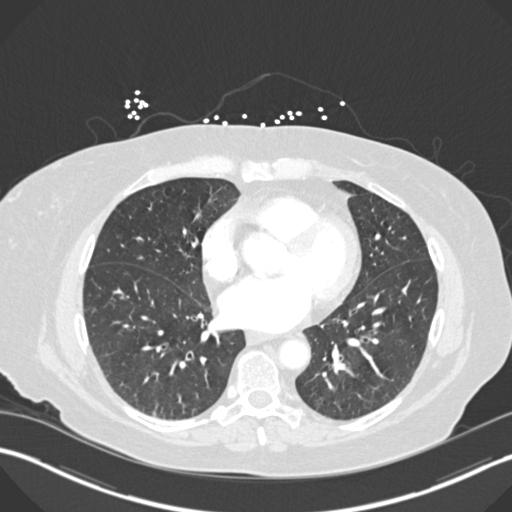
[im 131/262  mediastinal]
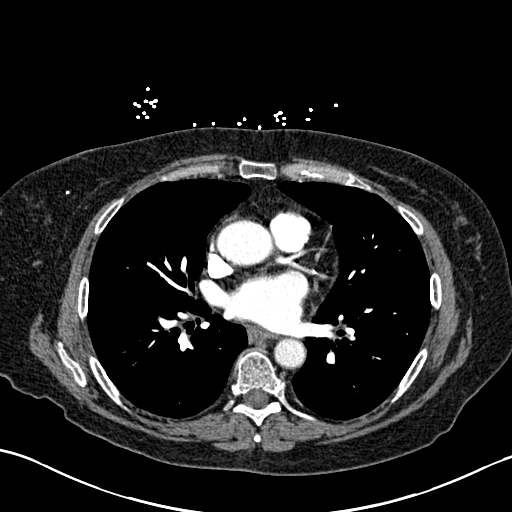
[im 147/262  lung]
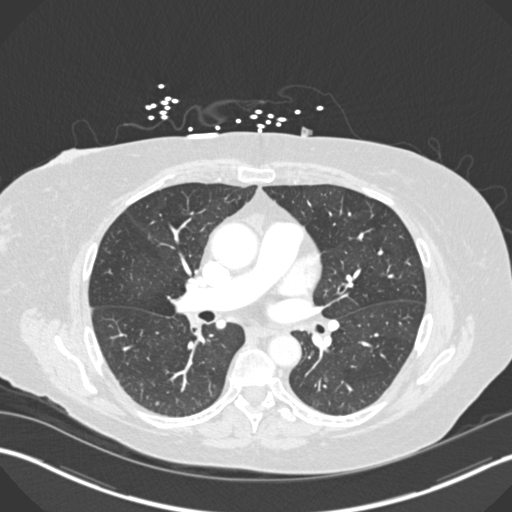
[im 164/262  mediastinal]
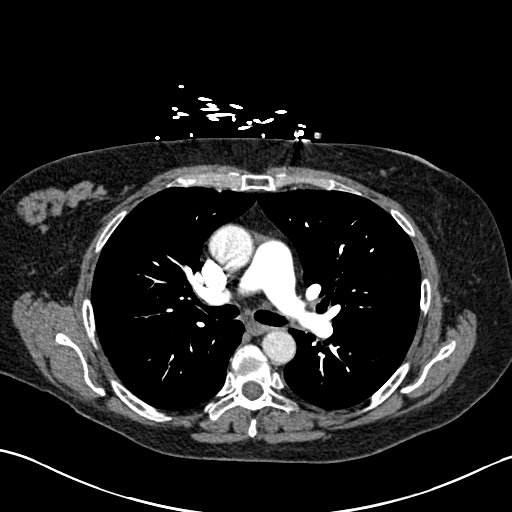
[im 180/262  lung]
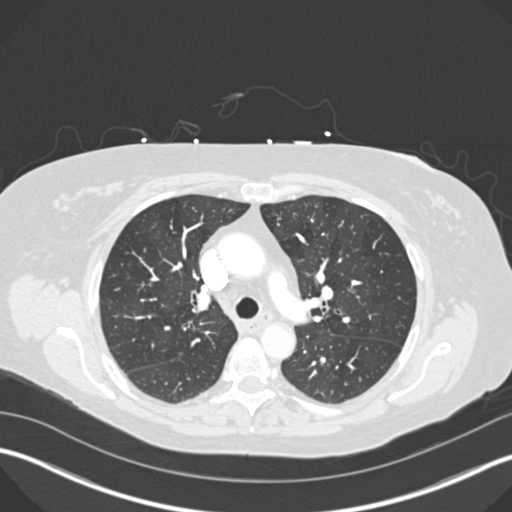
[im 196/262  mediastinal]
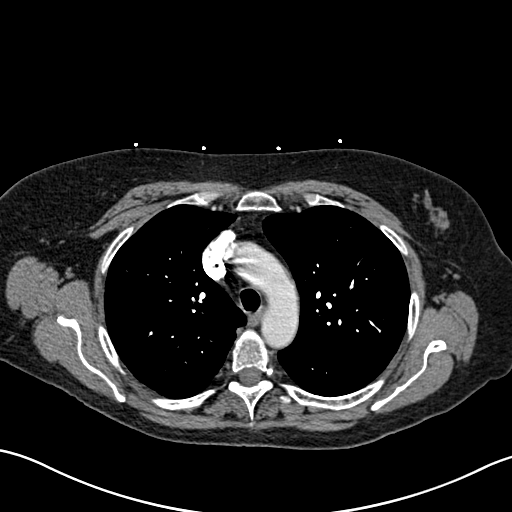
[im 213/262  lung]
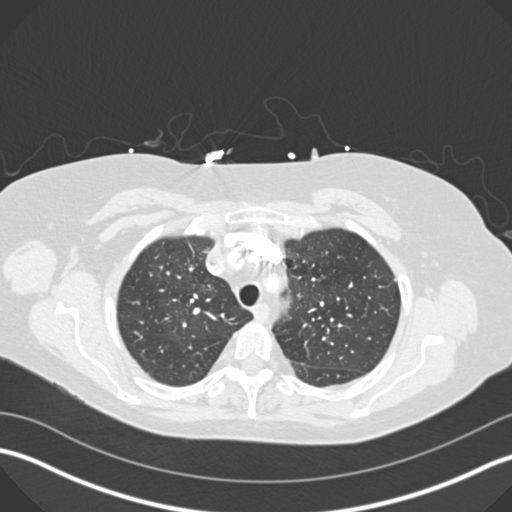
[im 229/262  mediastinal]
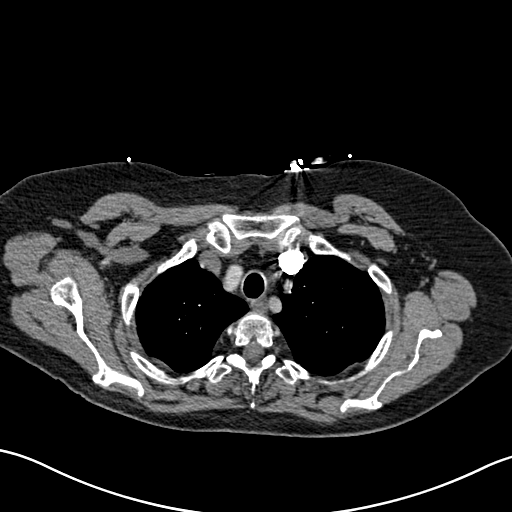
[im 245/262  lung]
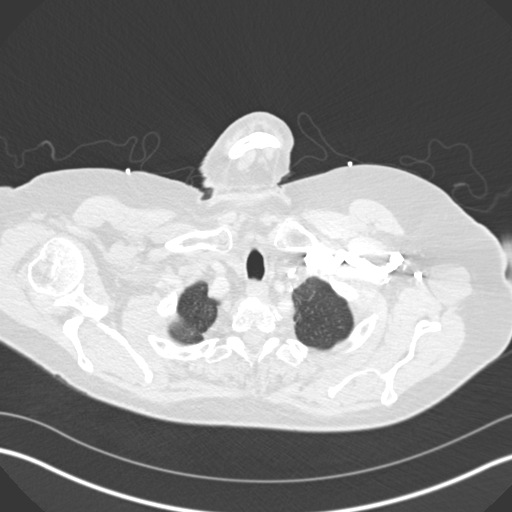

[Series 11: lung windows · axial · 0.74mm/px · z∈[-96,+24]mm · 3 of 80 slices shown]
[im 20/80  mediastinal]
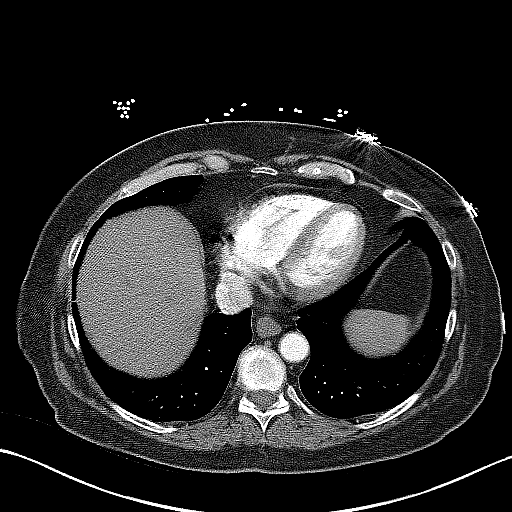
[im 40/80  mediastinal]
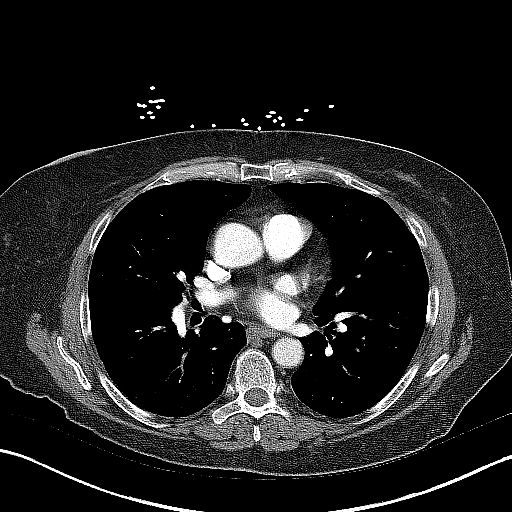
[im 60/80  mediastinal]
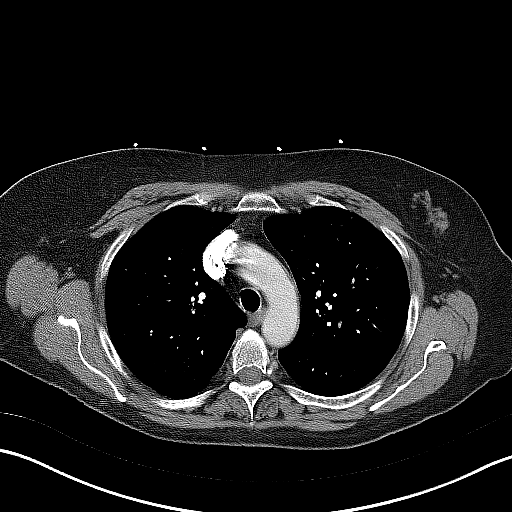

[19 of 36 positions shown; findings below may reference images not displayed]

FINDINGS: Cardiovascular: Satisfactory opacification the bilateral pulmonary
artery to the segmental level. No evidence of pulmonary embolism.

No evidence of thoracic aortic aneurysm or dissection.

The heart is normal in size.  No pericardial effusion.

Mediastinum/Nodes: No suspicious mediastinal lymphadenopathy.

Visualized thyroid is unremarkable.

Lungs/Pleura: Scattered tree in bud and subpleural nodularity in the
lungs bilaterally, with all nodules measuring less than 5 mm,
favored to reflect mild infection.

No pleural effusion or pneumothorax.

Upper Abdomen: Visualized upper abdomen is unremarkable.

Musculoskeletal: Visualized osseous structures are within normal
limits.

Review of the MIP images confirms the above findings.
IMPRESSION: No evidence of pulmonary embolism.

Scattered nodularity in the lungs bilaterally, favoring mild
infection.

## 2018-05-20 IMAGING — CR DG CHEST 2V
2 series · 2 of 2 positions shown · non-contrast
Comparison: Single-view of the chest 12/09/2016.

CLINICAL DATA: Left chest pain and shortness of breath since
physical therapy 12/15/2016.

EXAM:
CHEST  2 VIEW

[w chest pa]
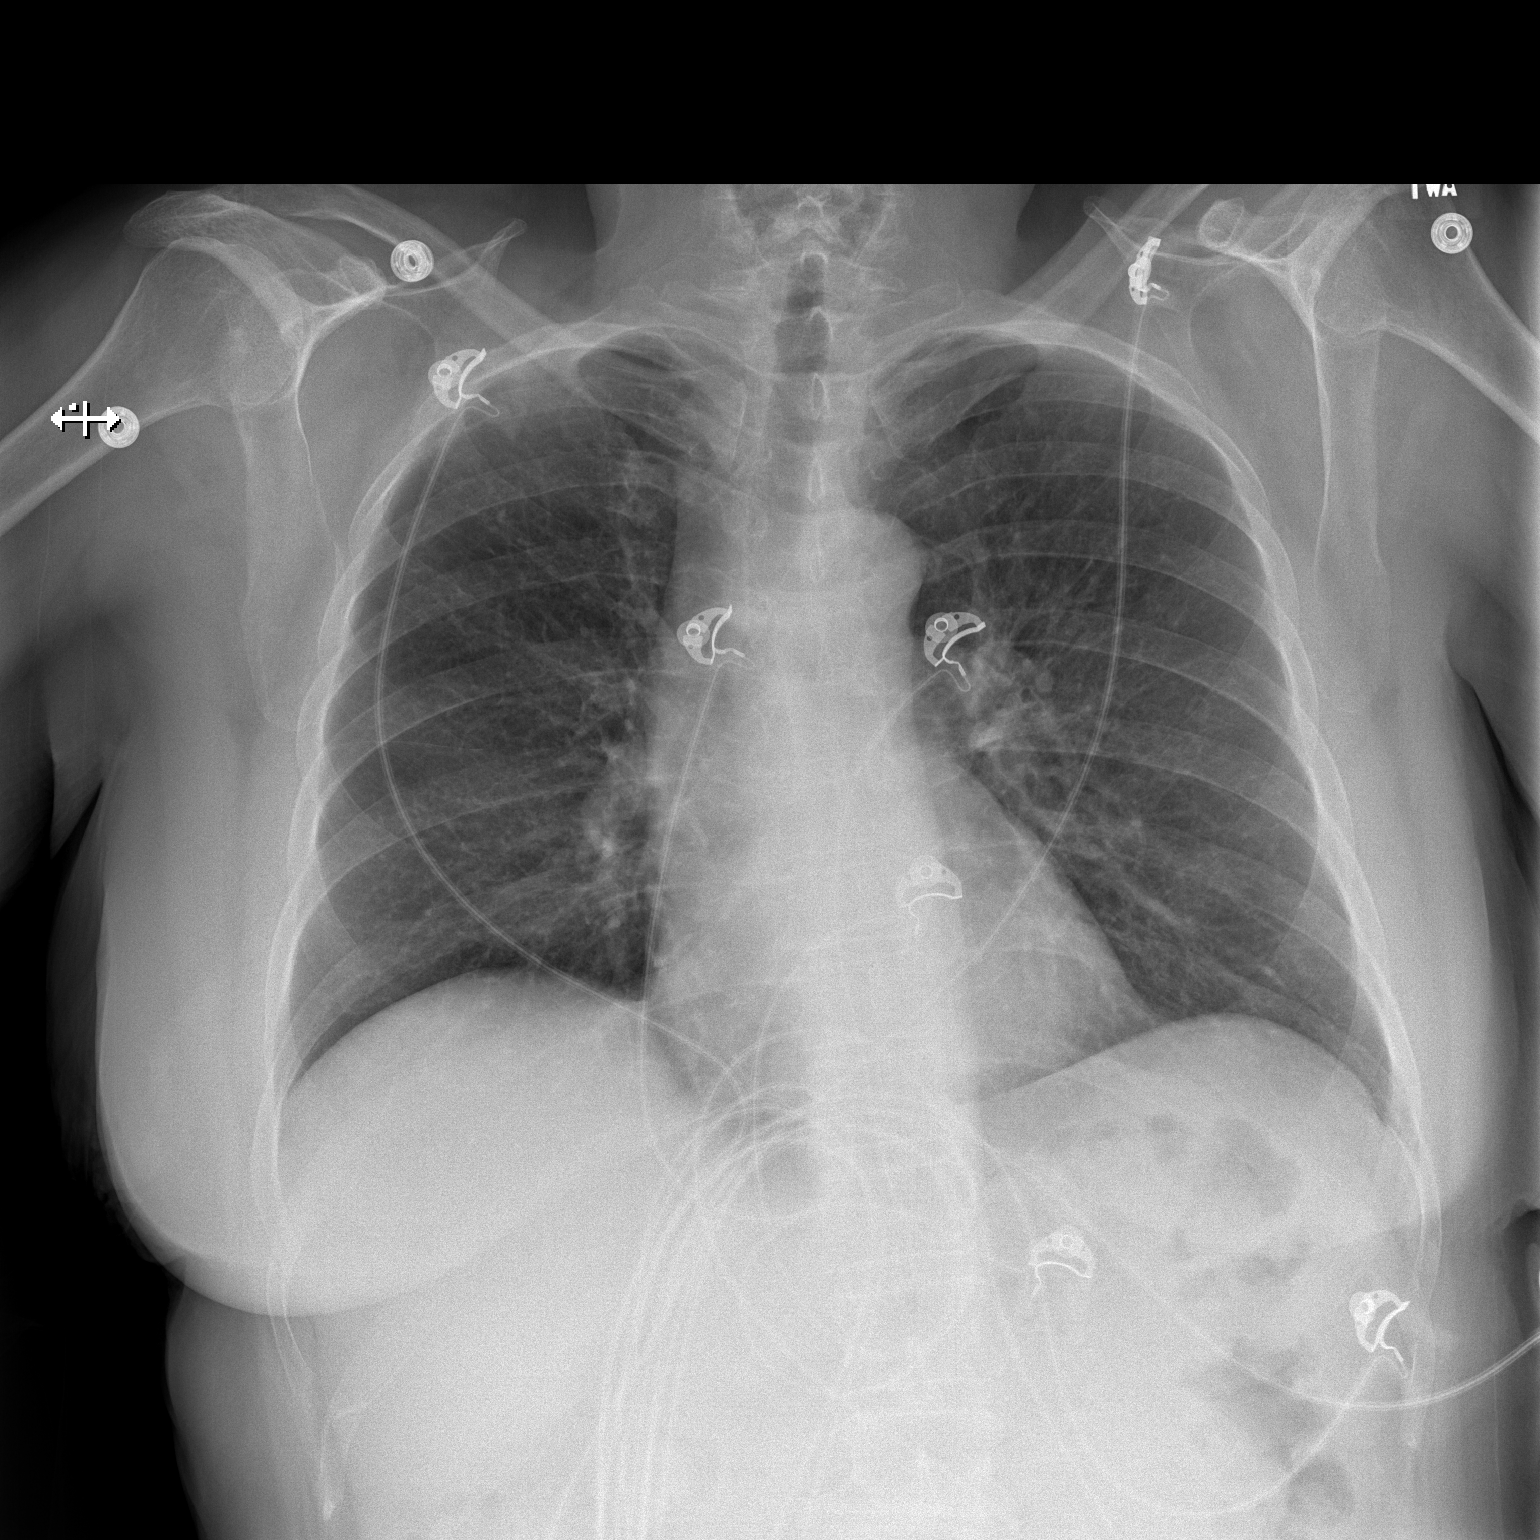

[w chest lat]
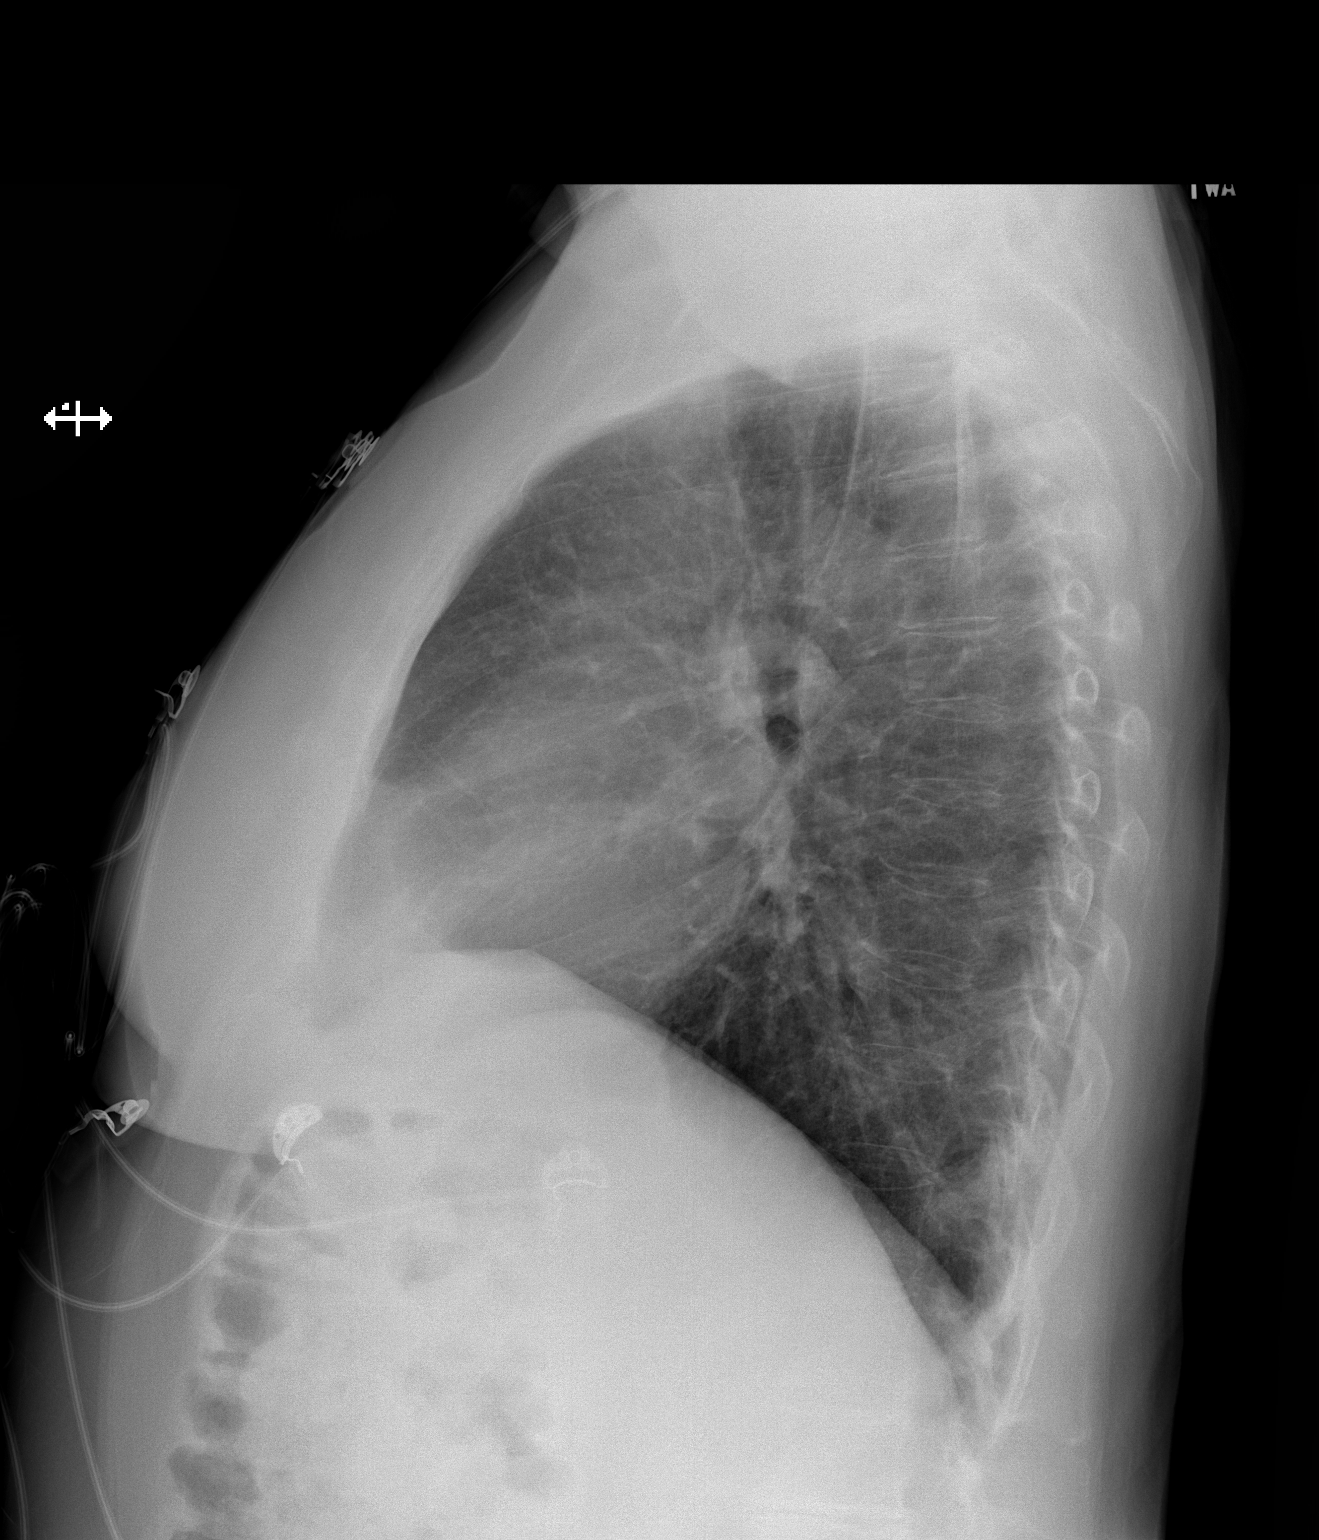

[2 of 2 positions shown; findings below may reference images not displayed]

FINDINGS: The lungs are clear. Heart size is normal. No pneumothorax or
pleural fluid. No acute bony abnormality. Postoperative change left
shoulder noted.
IMPRESSION: No acute disease.

## 2019-04-06 ENCOUNTER — Other Ambulatory Visit: Payer: Self-pay | Admitting: Psychiatry
